# Patient Record
Sex: Female | Born: 1990
Health system: Southern US, Community
[De-identification: ages and names within clinical notes are randomized; demographics above are authoritative.]

## PROBLEM LIST (undated history)

## (undated) ENCOUNTER — Inpatient Hospital Stay (HOSPITAL_COMMUNITY): Payer: Self-pay

## (undated) DIAGNOSIS — K3184 Gastroparesis: Secondary | ICD-10-CM

## (undated) DIAGNOSIS — R1031 Right lower quadrant pain: Secondary | ICD-10-CM

## (undated) DIAGNOSIS — K626 Ulcer of anus and rectum: Secondary | ICD-10-CM

## (undated) DIAGNOSIS — F419 Anxiety disorder, unspecified: Secondary | ICD-10-CM

## (undated) HISTORY — DX: Right lower quadrant pain: R10.31

## (undated) HISTORY — DX: Ulcer of anus and rectum: K62.6

## (undated) HISTORY — DX: Anxiety disorder, unspecified: F41.9

## (undated) HISTORY — DX: Gastroparesis: K31.84

---
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
## 2011-07-26 ENCOUNTER — Ambulatory Visit (INDEPENDENT_AMBULATORY_CARE_PROVIDER_SITE_OTHER): Payer: BC Managed Care – PPO | Admitting: Gastroenterology

## 2011-07-26 ENCOUNTER — Encounter: Payer: Self-pay | Admitting: Gastroenterology

## 2011-07-26 VITALS — BP 95/63 | HR 70 | Temp 97.6°F | Ht 65.0 in | Wt 143.2 lb

## 2011-07-26 DIAGNOSIS — K6289 Other specified diseases of anus and rectum: Secondary | ICD-10-CM | POA: Insufficient documentation

## 2011-07-26 DIAGNOSIS — R109 Unspecified abdominal pain: Secondary | ICD-10-CM

## 2011-07-26 MED ORDER — SOD PICOSULFATE-MAG OX-CIT ACD 10-3.5-12 MG-GM-GM PO PACK: 1.0000 | PACK | Freq: Once | ORAL | Status: DC

## 2011-07-26 NOTE — Progress Notes (Signed)
Faxed to PCP

## 2011-07-26 NOTE — Progress Notes (Signed)
  Subjective:    Patient ID: Andrea Huynh, female    DOB: 12-31-1990, 21 y.o.   MRN: [id number]  PCP: Prudy Feeler, MD  HPI Pain in rectum ~2 years ago-CT rectal inflammation. Has pain in right side. Had severe diarrhea. No rectal bleeding ever. Had been pain free for 2 years. Started ~1 mo ago AGAIN AND HAD DIARRHEA. Seen in ED and repeat CT shows inflammation. No FAM hX COSUIN HAD CROHN'S DISEASE/NO COLON CA OR POLYPS.  GRANDFATHER ?IBS. WEIGHT LOSS: 2 MOS AGO 150-160 LBS. UNINTENTIONAL LOSS. APPETITE OK . NO RASH OR SORES IN HER MOUTH. No joint pain or swelling. STOOLS VARY. WHEN SHE DOES HURT IT'S FOR A COUPLE DAYS. No rectal urgency. Has the urge to go but she doesn't go.  PAIN: RLQ, NO RADIATION, SHARP, KNIFELIKE-LASTS 2-3 DAYS. FEELS NAUSEA BUT NO VOMITIING. NO TRAVEL, OR FRESH WATER. WAS ON ABX IN DEC, BUT NO ABX PRIOR TO ED VISIT. ABX CAN CAUSE WATERY STOOL.  No past medical history on file.  No past surgical history on file.  No Known Allergies  No current outpatient prescriptions on file.   Family History  Problem Relation Age of Onset  . Colon cancer Neg Hx   . Colon polyps Neg Hx   . Stomach cancer Neg Hx   COUSIN HAD CROHN'S DISEASE  History   Social History  . 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 Smoking status: Never Smoker   . Smokeless tobacco: Not on file  . Alcohol Use: No  . Drug Use: No    Review of Systems     Objective:   Physical Exam  Vitals reviewed. Constitutional: She is oriented to person, place, and time. She appears well-developed and well-nourished. No distress.  HENT:  Head: Normocephalic and atraumatic.  Mouth/Throat: Oropharynx is clear and moist. No oropharyngeal exudate.  Eyes: Pupils are equal, round, and reactive to light. No scleral icterus.  Neck: Normal range of motion. Neck supple.  Cardiovascular: Normal rate, regular rhythm and normal heart sounds.     Pulmonary/Chest: Effort normal and breath sounds normal. No respiratory distress.  Abdominal: Soft. Bowel sounds are normal. She exhibits no distension. There is tenderness.  Musculoskeletal: Normal range of motion. She exhibits no edema.  Lymphadenopathy:    She has no cervical adenopathy.  Neurological: She is alert and oriented to person, place, and time.       NO FOCAL DEFICITS   Skin: No rash (pretibial) noted.  Psychiatric: She has a normal mood and affect.          Assessment & Plan:

## 2011-07-26 NOTE — Patient Instructions (Signed)
SUBMIT LOOSE OR WATERY STOOL SAMPLE ASAP. WE WILL SCHEDULE A COLONOSCOPY ON JAN 25. FOLLOW UP IN 3 MOS.

## 2011-07-27 NOTE — Assessment & Plan Note (Addendum)
Persistent finding on CT since 2010. PT LIKELY HAS CROHN'S ILEO-COLITIS. 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 NO NSAID USE.   CHECK CDIFF PCR. TCS JAN 25. OPV IN 3 MOS.

## 2011-08-01 ENCOUNTER — Encounter: Payer: Self-pay | Admitting: Gastroenterology

## 2011-08-01 ENCOUNTER — Encounter (HOSPITAL_COMMUNITY): Payer: Self-pay | Admitting: Pharmacy Technician

## 2011-08-02 NOTE — Progress Notes (Signed)
LMOM for pt to call and let us know if she has done the stool test.

## 2011-08-02 NOTE — Progress Notes (Unsigned)
Called and [facility] for pt to let us know if she has done the stool test . (C-Diff)  Pt returned call and said that the specimen that she took to the lab was not watery enough, her stools are more formed now.  She is scheduled for procedure on Fri.  Transferred call to  Crystal's VM, pt having difficulty with prep being too expensive.

## 2011-08-02 NOTE — Progress Notes (Signed)
Prep has been changed - and [facility] in [place]- I called and confirmed they received it

## 2011-08-02 NOTE — Progress Notes (Signed)
Remind pt to submit sample

## 2011-08-03 MED ORDER — SODIUM CHLORIDE 0.45 % IV SOLN
Freq: Once | INTRAVENOUS | Status: AC
  Administered 2011-08-04: 1000 mL via INTRAVENOUS

## 2011-08-04 ENCOUNTER — Other Ambulatory Visit: Payer: Self-pay | Admitting: Gastroenterology

## 2011-08-04 ENCOUNTER — Encounter (HOSPITAL_COMMUNITY)
Admission: RE | Disposition: A | Discharge status: Home / Self Care | Payer: Self-pay | Source: Ambulatory Visit | Attending: Gastroenterology

## 2011-08-04 ENCOUNTER — Encounter (HOSPITAL_COMMUNITY): Payer: Self-pay | Admitting: *Deleted

## 2011-08-04 ENCOUNTER — Ambulatory Visit (HOSPITAL_COMMUNITY)
Admission: RE | Admit: 2011-08-04 | Discharge: 2011-08-04 | Disposition: A | Discharge status: Home / Self Care | Payer: BC Managed Care – PPO | Source: Ambulatory Visit | Attending: Gastroenterology | Admitting: Gastroenterology

## 2011-08-04 DIAGNOSIS — K6289 Other specified diseases of anus and rectum: Secondary | ICD-10-CM

## 2011-08-04 DIAGNOSIS — K5289 Other specified noninfective gastroenteritis and colitis: Secondary | ICD-10-CM | POA: Insufficient documentation

## 2011-08-04 DIAGNOSIS — K626 Ulcer of anus and rectum: Secondary | ICD-10-CM | POA: Insufficient documentation

## 2011-08-04 DIAGNOSIS — R109 Unspecified abdominal pain: Secondary | ICD-10-CM

## 2011-08-04 DIAGNOSIS — R197 Diarrhea, unspecified: Secondary | ICD-10-CM

## 2011-08-04 DIAGNOSIS — R933 Abnormal findings on diagnostic imaging of other parts of digestive tract: Secondary | ICD-10-CM

## 2011-08-04 HISTORY — PX: COLONOSCOPY: SHX5424

## 2011-08-04 SURGERY — COLONOSCOPY
Anesthesia: Moderate Sedation

## 2011-08-04 MED ORDER — HYDROCODONE-ACETAMINOPHEN 5-325 MG PO TABS
ORAL_TABLET | ORAL | Status: AC
Start: 2011-08-04 — End: 2011-08-14

## 2011-08-04 MED ORDER — MEPERIDINE HCL 100 MG/ML IJ SOLN
INTRAMUSCULAR | Status: DC | PRN
  Administered 2011-08-04: 25 mg via INTRAVENOUS
  Administered 2011-08-04: 50 mg via INTRAVENOUS

## 2011-08-04 MED ORDER — MEPERIDINE HCL 100 MG/ML IJ SOLN
INTRAMUSCULAR | Status: AC
  Filled 2011-08-04: qty 2

## 2011-08-04 MED ORDER — MIDAZOLAM HCL 5 MG/5ML IJ SOLN
INTRAMUSCULAR | Status: DC | PRN
  Administered 2011-08-04 (×2): 2 mg via INTRAVENOUS

## 2011-08-04 MED ORDER — MIDAZOLAM HCL 5 MG/5ML IJ SOLN
INTRAMUSCULAR | Status: AC
  Filled 2011-08-04: qty 10

## 2011-08-04 NOTE — Progress Notes (Signed)
Reminder in epic to follow up in 3 months °

## 2011-08-04 NOTE — H&P (View-Only) (Signed)
  Subjective:    Patient ID: Andrea Huynh, female    DOB: 07/23/1990, 21 y.o.   MRN: [id number]  PCP: ANGEL JONES, MD  HPI Pain in rectum ~2 years ago-CT rectal inflammation. Has pain in right side. Had severe diarrhea. No rectal bleeding ever. Had been pain free for 2 years. Started ~1 mo ago AGAIN AND HAD DIARRHEA. Seen in ED and repeat CT shows inflammation. No FAM hX COSUIN HAD CROHN'S DISEASE/NO COLON CA OR POLYPS.  GRANDFATHER ?IBS. WEIGHT LOSS: 2 MOS AGO 150-160 LBS. UNINTENTIONAL LOSS. APPETITE OK . NO RASH OR SORES IN HER MOUTH. No joint pain or swelling. STOOLS VARY. WHEN SHE DOES HURT IT'S FOR A COUPLE DAYS. No rectal urgency. Has the urge to go but she doesn't go.  PAIN: RLQ, NO RADIATION, SHARP, KNIFELIKE-LASTS 2-3 DAYS. FEELS NAUSEA BUT NO VOMITIING. NO TRAVEL, OR FRESH WATER. WAS ON ABX IN DEC, BUT NO ABX PRIOR TO ED VISIT. ABX CAN CAUSE WATERY STOOL.  No past medical history on file.  No past surgical history on file.  No Known Allergies  No current outpatient prescriptions on file.   Family History  Problem Relation Age of Onset  . Colon cancer Neg Hx   . Colon polyps Neg Hx   . Stomach cancer Neg Hx   COUSIN HAD CROHN'S DISEASE  History   Social History  . 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 Smoking status: Never Smoker   . Smokeless tobacco: Not on file  . Alcohol Use: No  . Drug Use: No    Review of Systems     Objective:   Physical Exam  Vitals reviewed. Constitutional: She is oriented to person, place, and time. She appears well-developed and well-nourished. No distress.  HENT:  Head: Normocephalic and atraumatic.  Mouth/Throat: Oropharynx is clear and moist. No oropharyngeal exudate.  Eyes: Pupils are equal, round, and reactive to light. No scleral icterus.  Neck: Normal range of motion. Neck supple.  Cardiovascular: Normal rate, regular rhythm and normal heart sounds.     Pulmonary/Chest: Effort normal and breath sounds normal. No respiratory distress.  Abdominal: Soft. Bowel sounds are normal. She exhibits no distension. There is tenderness.  Musculoskeletal: Normal range of motion. She exhibits no edema.  Lymphadenopathy:    She has no cervical adenopathy.  Neurological: She is alert and oriented to person, place, and time.       NO FOCAL DEFICITS   Skin: No rash (pretibial) noted.  Psychiatric: She has a normal mood and affect.          Assessment & Plan:   

## 2011-08-04 NOTE — Interval H&P Note (Signed)
History and Physical Interval Note:  08/04/2011 11:18 AM  Andrea Huynh  has presented today for surgery, with the diagnosis of abd pain  The various methods of treatment have been discussed with the patient and family. After consideration of risks, benefits and other options for treatment, the patient has consented to  Procedure(s): COLONOSCOPY as a surgical intervention .  The patients' history has been reviewed, patient examined, no change in status, stable for surgery.  I have reviewed the patients' chart and labs.  Questions were answered to the patient's satisfaction.     [employer]

## 2011-08-06 NOTE — Op Note (Signed)
[facility] [address] [place], [place]  [address]  COLONOSCOPY PROCEDURE REPORT  PATIENT:  Andrea Huynh, Andrea Huynh  MR#:  [id number] BIRTHDATE:  03/23/1991, 20 yrs. old  GENDER:  female  ENDOSCOPIST:  Jonette Eva, MD REF. BY: ASSISTANT:  PROCEDURE DATE:  08/04/2011 PROCEDURE:  ILEOColonoscopy with biopsy  INDICATIONS:  DIARRHEA, & ABDOMNAL PAIN RECTAL WALL ABNL ON CT IN 2010 & 2012  MEDICATIONS:   Demerol 75 mg IV, Versed 4 mg IV  DESCRIPTION OF PROCEDURE:    Physical exam was performed. Informed consent was obtained from the patient after explaining the benefits, risks, and alternatives to procedure.  The patient was connected to monitor and placed in left lateral position. Continuous oxygen was provided by nasal cannula and IV medicine administered through an indwelling cannula.  After administration of sedation and rectal exam, the patient's rectum was intubated and the EC-[id number] ([id number]) colonoscope was advanced under direct visualization to the ILEUM.  The scope was removed slowly by carefully examining the color, texture, anatomy, and integrity mucosa on the way out.  The patient was recovered in endoscopy and discharged home in satisfactory condition. <<PROCEDUREIMAGES>>  FINDINGS:  Ulcers in the rectum BIOPSIED VIA COLD FORCEPS. NL COLON & ILEUM. BIOPSIES OBTAIED VIA COLD FORCEPS.  PREP QUALITY: EXCELLENT CECAL W/D TIME:    20 minutes  COMPLICATIONS:    None  ENDOSCOPIC IMPRESSION: 1) Ulcers in the rectum 2) NORMAL ILEUM & COLON  RECOMMENDATIONS: AWAIT BIOPSIES AVOID NSAIDS  REPEAT EXAM:  No  ______________________________ Jonette Eva, MD  CC:  n. eSIGNED:   Mireya Meditz at 08/06/2011 03:52 PM  Cristopher Estimable, [phone]

## 2011-08-09 ENCOUNTER — Telehealth: Payer: Self-pay | Admitting: Gastroenterology

## 2011-08-09 MED ORDER — LINACLOTIDE 145 MCG PO CAPS: 1.0000 | ORAL_CAPSULE | Freq: Every day | ORAL | Status: DC

## 2011-08-09 NOTE — Telephone Encounter (Signed)
Referral faxed to [facility] 

## 2011-08-09 NOTE — Telephone Encounter (Signed)
I personally reviewed the specimens with pathology. CALLED PT TO DISCUSS RESULTS. Pt does not have evidence of IBD. ShE has 1-2 BMs/week and strains frequently to have Bms. PT NEEDS LINZESS TO TREAT CIC & ANORECTAL MANOMETRY WITH BALLOON EXPULSION TEST asap AT [facility], dX: SOLITARY RECTAL ULCER SYNDROME-evaluate for sphincter dysfunction.

## 2011-08-10 ENCOUNTER — Encounter (HOSPITAL_COMMUNITY): Payer: Self-pay | Admitting: Gastroenterology

## 2011-08-10 NOTE — Telephone Encounter (Signed)
Results Cc to PCP  

## 2011-08-14 ENCOUNTER — Telehealth: Payer: Self-pay | Admitting: Gastroenterology

## 2011-08-14 NOTE — Telephone Encounter (Signed)
Patient said insurance does not pay for Linzess and shes asking for something cheaper to replace it, please advise??

## 2011-08-15 NOTE — Telephone Encounter (Signed)
Called Rx to CC at [facility] in [place]. LMOM for pt that it has been called in .

## 2011-08-15 NOTE — Telephone Encounter (Signed)
CALL IN AMITIZA 24 MCG PO QHS FOR 7 DAYS THEN 1 PO BID, #[phone].

## 2011-08-17 ENCOUNTER — Telehealth: Payer: Self-pay

## 2011-08-17 NOTE — Telephone Encounter (Signed)
LATE ENTRY: Pt called at end of day yesterday and said she cannot afford the Amitiza either. Please advise!

## 2011-08-20 NOTE — Telephone Encounter (Signed)
Please call pt. She should use MOM caps 2 po bid.

## 2011-08-21 NOTE — Telephone Encounter (Signed)
Pt informed

## 2011-09-13 ENCOUNTER — Telehealth: Payer: Self-pay | Admitting: Gastroenterology

## 2011-09-13 NOTE — Telephone Encounter (Signed)
Pt had anorectal manometry on 02/15

## 2011-09-21 ENCOUNTER — Telehealth: Payer: Self-pay

## 2011-09-21 NOTE — Telephone Encounter (Signed)
[facility]- got report- gave to DS

## 2011-09-21 NOTE — Telephone Encounter (Signed)
Per Lorenza Burton, NP, no pelvic floor dysfuncion is present. Dr. Evelina Dun will review and make recommendations when she returns on Mon.

## 2011-09-21 NOTE — Telephone Encounter (Signed)
Pt would like results of anorectal manometry done on 08/25/2011 at [facility]. I told her I will have Crystal check on this.

## 2011-09-25 NOTE — Telephone Encounter (Signed)
REPORTEDLY ARM-NO PELVIC FLOOR DYSFUNCTION. OPV IN APR 2013 e 30 TO DISCUSS NEXT STEP, Dx: solitary rectal syndrome.. Please make pt aware she will need to have a rectal exam with her next visit.

## 2011-09-26 NOTE — Telephone Encounter (Signed)
Pt informed

## 2011-09-29 ENCOUNTER — Encounter: Payer: Self-pay | Admitting: Gastroenterology

## 2011-09-29 NOTE — Telephone Encounter (Signed)
appt made for 04/18 - letter mailed

## 2011-10-26 ENCOUNTER — Ambulatory Visit (INDEPENDENT_AMBULATORY_CARE_PROVIDER_SITE_OTHER): Payer: BC Managed Care – PPO | Admitting: Gastroenterology

## 2011-10-26 ENCOUNTER — Encounter: Payer: Self-pay | Admitting: Gastroenterology

## 2011-10-26 VITALS — BP 102/55 | HR 63 | Temp 98.1°F | Ht 65.0 in | Wt 131.0 lb

## 2011-10-26 DIAGNOSIS — K623 Rectal prolapse: Secondary | ICD-10-CM

## 2011-10-26 NOTE — Progress Notes (Signed)
  Subjective:    Patient ID: Andrea Huynh, female    DOB: [birth year], 21 y.o.   MRN: [id number]  PCP: Yetta Barre  HPI Pain in RLQ is better. BMs: q2-3 days, not loose unless she eats the wrong thing. Feels like when she does have a BM, she doesn't complete empty her rectum. No fiber supplements. After she uses the BR, she feels like something is there sometimes. LOST 12 LBS SINCE LAST VISIT. 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  Past Medical History  Diagnosis Date  . Abdominal pain, RLQ 2010    Past Surgical History  Procedure Date  . Colonoscopy 08/04/2011    Procedure: COLONOSCOPY;  Surgeon: Arlyce Harman, MD;  Location: [facility];  Service: Endoscopy;  Laterality: N/A;  12:30   No Known Allergies  No current outpatient prescriptions on file.       Review of Systems     Objective:   Physical Exam  Vitals reviewed. Constitutional: She is oriented to person, place, and time. She appears well-developed and well-nourished. No distress.  HENT:  Head: Normocephalic and atraumatic.  Mouth/Throat: Oropharynx is clear and moist. No oropharyngeal exudate.  Eyes: Pupils are equal, round, and reactive to light.  Neck: Normal range of motion. Neck supple.  Cardiovascular: Normal rate and normal heart sounds.   Pulmonary/Chest: Effort normal and breath sounds normal. No respiratory distress.  Abdominal: Soft. Bowel sounds are normal. She exhibits no distension. There is no tenderness.  Musculoskeletal: She exhibits no edema.  Neurological: She is alert and oriented to person, place, and time.          Assessment & Plan:

## 2011-10-26 NOTE — Assessment & Plan Note (Signed)
Pt has symptoms of rectal prolapse and identified prolapse from images on the internet. She has had a TCS with rectal ulcer W/O EVIDENCE FOR IBD. SHE DOES NOT HAVE HEMORRHOIDS.  ADD FIBER 1-2X/DAY. KEGEK EXERCISES 10X(HOLD FOR 5 SEC) 2-3 TIMES A DAY. REFER TO DR. Lovell Sheehan FOR SURGICAL EVALUATION. FOLLOW UP IN 6 MOS.

## 2011-10-26 NOTE — Patient Instructions (Signed)
I BELIEVE YOUR SYMPTOMS AND ULCERS ARE COMING FROM RECTAL PROLAPSE.  ADD FIBER ONCE OR TWICE DAILY(METAMUCIL OR BENEFIBER) DRINK WATER TO KEEP URINE LIGHT YELLOW. SEE DR. Lovell Sheehan TO REPAIR YOUR RECTUM. FOLLOW UP IN 6 MOS.  What is rectal prolapse? Rectal prolapse occurs when part or all of the wall of the rectum slides out of place, sometimes sticking out of the anus. See a picture of rectal prolapse .  Risk factors for adults include:  Straining during bowel movements because of constipation.  Tissue damage caused by surgery or childbirth.  Weakness of pelvic floor muscles that occurs naturally with age.  What are the symptoms? The first symptoms of rectal prolapse may be: Leakage of stool from the anus (fecal incontinence).  Leakage of mucus or blood from the anus (wet anus).  Other symptoms of rectal prolapse include: A feeling of having full bowels and an urgent need to have a bowel movement.  Passage of many very small stools.  The feeling of not being able to empty the bowels completely.  Anal pain, itching, irritation, and bleeding.  Bright red tissue that sticks out of the anus.   TREATMENT for adults may help treat the prolapse and may be tried before other types of treatments.  You can push the prolapse into place.  Avoid constipation. Drink plenty of water, and eat fruits, vegetables, and other foods that contain fiber. Changes in diet often are enough to improve or reverse a prolapse of the lining of the rectum (partial prolapse).  Do Kegel exercises to help strengthen the muscles of the pelvic area.  Don't strain while having a bowel movement. Use a stool softener if you need to.   People who have a complete prolapse or who have a partial prolapse that doesn't improve with a change in diet will need surgery. Surgery involves attaching the rectum to the muscles of the pelvic floor or the lower end of the spine (sacrum). Or surgery might involve removing a section of the  large intestine that is no longer supported by the surrounding tissue. Both procedures may be done in the same surgery.

## 2011-10-26 NOTE — Progress Notes (Signed)
Reminder in epic to follow up in 6 months °

## 2011-10-26 NOTE — Progress Notes (Signed)
Cc to PCP 

## 2011-10-30 ENCOUNTER — Telehealth: Payer: Self-pay | Admitting: Gastroenterology

## 2011-10-30 NOTE — Telephone Encounter (Signed)
Pt is scheduled to see Dr Lovell Sheehan on 04/25 @ 10:15- LMOVM with appt details

## 2012-04-04 ENCOUNTER — Encounter: Payer: Self-pay | Admitting: Gastroenterology

## 2012-04-19 NOTE — Progress Notes (Signed)
Patient ID: Andrea Huynh, female   DOB: 1990/10/01, 21 y.o.   MRN: [id number] Pt called today and said that she was not having any problems so she did not think she needed to come in for an appointment.

## 2013-03-25 ENCOUNTER — Telehealth: Payer: Self-pay | Admitting: General Practice

## 2013-03-25 NOTE — Telephone Encounter (Signed)
Patient called and wanted to know when should she have a repeat tcs.  She had her last one in January 2013.  Please advise?

## 2013-03-30 NOTE — Telephone Encounter (Signed)
PLEASE CALL PT. SHE NEEDS ANOTHER TCS AT AGE 22 UNLESS A SIBLING OR PARENT DEVELOPS COLON CANCER OR POLYPS. IF SHE IS HAVING RECTAL PAIN, BLEEDING, OR DIARRHEA. SHE WOULD NEED A REPEAT FLEX SIG WHICH LOOKS AT THE LAST THIRD OF HER COLON.

## 2013-03-31 NOTE — Telephone Encounter (Signed)
Called and informed pt.  

## 2013-12-17 ENCOUNTER — Encounter: Payer: Self-pay | Admitting: Gastroenterology

## 2013-12-17 ENCOUNTER — Other Ambulatory Visit: Payer: Self-pay | Admitting: Gastroenterology

## 2013-12-17 ENCOUNTER — Encounter (INDEPENDENT_AMBULATORY_CARE_PROVIDER_SITE_OTHER): Payer: Self-pay

## 2013-12-17 ENCOUNTER — Ambulatory Visit (INDEPENDENT_AMBULATORY_CARE_PROVIDER_SITE_OTHER): Payer: BC Managed Care – PPO | Admitting: Gastroenterology

## 2013-12-17 VITALS — BP 114/66 | HR 70 | Temp 97.9°F | Resp 18 | Ht 65.0 in | Wt 142.8 lb

## 2013-12-17 DIAGNOSIS — K6289 Other specified diseases of anus and rectum: Secondary | ICD-10-CM

## 2013-12-17 DIAGNOSIS — K623 Rectal prolapse: Secondary | ICD-10-CM

## 2013-12-17 DIAGNOSIS — K5900 Constipation, unspecified: Secondary | ICD-10-CM

## 2013-12-17 NOTE — Assessment & Plan Note (Signed)
LIKELY RELATED TO RECTAL PROLAPSE. SX MAY BE EXACERBATED BY FUNCTIONAL CONSTIPATION. PT HAD ANAL RECTAL MANOMETRY IN 2013 AND SHE DOES NOT HAVE PELVIC FLOOR DYSFUNCTION. DISCUSSED WITH MARK BOLES. SITZ MARKER CAPSULE NO LONGER AVAILABLE.   SMART PILL TO EVALUATE FOR DELAYED COLONIC TRANSIT. FAX ANAL RECTAL MANOMETRY RECORDS TO DR. Lovell Sheehan.

## 2013-12-17 NOTE — Patient Instructions (Signed)
COMPLETE SMART PILL.  SCHEDULE FLEX SIG.  SEE SURGERY IN 3 WEEKS.  FOLLOW UP IN 3 MOS.

## 2013-12-17 NOTE — Progress Notes (Signed)
   Subjective:    Patient ID: Andrea Huynh, female    DOB: 03/04/1991, 23 y.o.   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 STILL HAVING PROBLEMS PASSING STOOLS. NO RECTAL BLEEDING EVER. BM: #5-6. RARE NL FORMED STOOL. WAITED ONE WEEK TO HAVE A BM BUT IT WAS NORMAL. NEVER SAW A SURGEON. STOMACH FELT TIGHT WHEN SHE COULDN'T MOVE R BOWELS. FELT LIKE SHE HAD A LOT OF GAS. FIBER MAKES HER SYMPTOMS. AFTER HAS BM HAS MILD RECTAL PAIN. NO RECTAL ITCHING OR PRESSURE. PT DENIES FEVER, CHILLS, BRBPR, nausea, vomiting, melena, diarrhea, problems swallowing, OR heartburn or indigestion.   Past Medical History  Diagnosis Date  . Abdominal pain, RLQ 2010   Past Surgical History  Procedure Laterality Date  . Colonoscopy  08/04/2011    Procedure: COLONOSCOPY;  Surgeon: Jette Lewan M Alicia Ackert, MD;  Location: [facility];  Service: Endoscopy;  Laterality: N/A;  12:30   No Known Allergies  No current outpatient prescriptions on file.   No current facility-administered medications for this visit.   Family History  Problem Relation Age of Onset  . Colon cancer Neg Hx   . Colon polyps Neg Hx   . Stomach cancer Neg Hx   . Constipation      History  Substance Use Topics  . Smoking status: Never Smoker   . Smokeless tobacco: Not on file  . Alcohol Use: No      Review of Systems     Objective:   Physical Exam  Vitals reviewed. Constitutional: She is oriented to person, place, and time. She appears well-nourished. No distress.  HENT:  Head: Normocephalic and atraumatic.  Mouth/Throat: Oropharynx is clear and moist. No oropharyngeal exudate.  Eyes: Pupils are equal, round, and reactive to light. No scleral icterus.  Neck: Normal range of motion. Neck supple.  Cardiovascular: Normal rate, regular rhythm and normal heart sounds.   Pulmonary/Chest: Effort normal and breath sounds normal. No respiratory distress.  Abdominal: Soft. Bowel sounds  are normal. She exhibits no distension. There is no tenderness.  Musculoskeletal: She exhibits no edema.  Lymphadenopathy:    She has no cervical adenopathy.  Neurological: She is alert and oriented to person, place, and time.  NO FOCAL DEFICITS   Psychiatric: She has a normal mood and affect.          Assessment & Plan:   

## 2013-12-17 NOTE — Assessment & Plan Note (Signed)
SX NOT IMRPOVED. WORSE WITH FIBER.  SMART PILL FLEX SIG SURGERY REFERRAL OPV IN 4 MOS

## 2013-12-18 ENCOUNTER — Telehealth: Payer: Self-pay | Admitting: Gastroenterology

## 2013-12-18 NOTE — Telephone Encounter (Signed)
Patient has decided to see Dr. Byrd Hesselbach at [facility] as an General Surgeon and I have faxed records to them and cancelled appointment with Dr. Lovell Sheehan

## 2013-12-18 NOTE — Telephone Encounter (Signed)
No PAC is Required for Smartpill Study per Buckhorn C @ [employer]

## 2013-12-19 NOTE — Progress Notes (Signed)
Reminder in epic °

## 2013-12-22 ENCOUNTER — Encounter (HOSPITAL_COMMUNITY): Payer: Self-pay

## 2013-12-22 ENCOUNTER — Encounter (HOSPITAL_COMMUNITY): Payer: Self-pay | Admitting: *Deleted

## 2013-12-22 ENCOUNTER — Ambulatory Visit (HOSPITAL_COMMUNITY)
Admission: RE | Admit: 2013-12-22 | Discharge: 2013-12-22 | Disposition: A | Discharge status: Home / Self Care | Payer: BC Managed Care – PPO | Source: Ambulatory Visit | Attending: Gastroenterology | Admitting: Gastroenterology

## 2013-12-22 ENCOUNTER — Encounter (HOSPITAL_COMMUNITY)
Admission: RE | Disposition: A | Discharge status: Home / Self Care | Payer: Self-pay | Source: Ambulatory Visit | Attending: Gastroenterology

## 2013-12-22 DIAGNOSIS — K6289 Other specified diseases of anus and rectum: Secondary | ICD-10-CM | POA: Insufficient documentation

## 2013-12-22 DIAGNOSIS — K5900 Constipation, unspecified: Secondary | ICD-10-CM

## 2013-12-22 SURGERY — CAPSULE ENDOSCOPY, USING SMARTPILL MOTILITY TESTING SYSTEM

## 2013-12-22 NOTE — OR Nursing (Addendum)
Patient in for Smart Pill. Asked that her report be faxed to her surgeon[facility]Dr[facility] along with her flexible sigmoidoscopy procedure note that she is due to have next Monday. Fax number is [phone].

## 2013-12-29 ENCOUNTER — Encounter (HOSPITAL_COMMUNITY)
Admission: RE | Disposition: A | Discharge status: Home / Self Care | Payer: Self-pay | Source: Ambulatory Visit | Attending: Gastroenterology

## 2013-12-29 ENCOUNTER — Encounter (HOSPITAL_COMMUNITY): Payer: Self-pay | Admitting: *Deleted

## 2013-12-29 ENCOUNTER — Ambulatory Visit (HOSPITAL_COMMUNITY)
Admission: RE | Admit: 2013-12-29 | Discharge: 2013-12-29 | Disposition: A | Discharge status: Home / Self Care | Payer: BC Managed Care – PPO | Source: Ambulatory Visit | Attending: Gastroenterology | Admitting: Gastroenterology

## 2013-12-29 DIAGNOSIS — K5900 Constipation, unspecified: Secondary | ICD-10-CM | POA: Diagnosis present

## 2013-12-29 DIAGNOSIS — K6289 Other specified diseases of anus and rectum: Secondary | ICD-10-CM

## 2013-12-29 DIAGNOSIS — K5120 Ulcerative (chronic) proctitis without complications: Secondary | ICD-10-CM | POA: Diagnosis not present

## 2013-12-29 HISTORY — PX: FLEXIBLE SIGMOIDOSCOPY: SHX5431

## 2013-12-29 SURGERY — SIGMOIDOSCOPY, FLEXIBLE
Anesthesia: Moderate Sedation

## 2013-12-29 MED ORDER — MEPERIDINE HCL 100 MG/ML IJ SOLN
INTRAMUSCULAR | Status: DC | PRN
  Administered 2013-12-29 (×2): 25 mg via INTRAVENOUS

## 2013-12-29 MED ORDER — MEPERIDINE HCL 100 MG/ML IJ SOLN
INTRAMUSCULAR | Status: AC
  Filled 2013-12-29: qty 2

## 2013-12-29 MED ORDER — MIDAZOLAM HCL 5 MG/5ML IJ SOLN
INTRAMUSCULAR | Status: DC | PRN
  Administered 2013-12-29 (×2): 2 mg via INTRAVENOUS

## 2013-12-29 MED ORDER — SODIUM CHLORIDE 0.9 % IV SOLN
INTRAVENOUS | Status: DC
  Administered 2013-12-29: 11:00:00 via INTRAVENOUS

## 2013-12-29 MED ORDER — MIDAZOLAM HCL 5 MG/5ML IJ SOLN
INTRAMUSCULAR | Status: AC
  Filled 2013-12-29: qty 10

## 2013-12-29 MED ORDER — STERILE WATER FOR IRRIGATION IR SOLN
Status: DC | PRN
  Administered 2013-12-29: 12:00:00

## 2013-12-29 NOTE — H&P (View-Only) (Signed)
   Subjective:    Patient ID: Andrea Huynh, female    DOB: 1990-10-30, 23 y.o.   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 STILL HAVING PROBLEMS PASSING STOOLS. NO RECTAL BLEEDING EVER. BM: #5-6. RARE NL FORMED STOOL. WAITED ONE WEEK TO HAVE A BM BUT IT WAS NORMAL. NEVER SAW A SURGEON. STOMACH FELT TIGHT WHEN SHE COULDN'T MOVE R BOWELS. FELT LIKE SHE HAD A LOT OF GAS. FIBER MAKES HER SYMPTOMS. AFTER HAS BM HAS MILD RECTAL PAIN. NO RECTAL ITCHING OR PRESSURE. PT DENIES FEVER, CHILLS, BRBPR, nausea, vomiting, melena, diarrhea, problems swallowing, OR heartburn or indigestion.   Past Medical History  Diagnosis Date  . Abdominal pain, RLQ 2010   Past Surgical History  Procedure Laterality Date  . Colonoscopy  08/04/2011    Procedure: COLONOSCOPY;  Surgeon: Arlyce HarmanSandi M Pradeep Beaubrun, MD;  Location: [facility];  Service: Endoscopy;  Laterality: N/A;  12:30   No Known Allergies  No current outpatient prescriptions on file.   No current facility-administered medications for this visit.   Family History  Problem Relation Age of Onset  . Colon cancer Neg Hx   . Colon polyps Neg Hx   . Stomach cancer Neg Hx   . Constipation      History  Substance Use Topics  . Smoking status: Never Smoker   . Smokeless tobacco: Not on file  . Alcohol Use: No      Review of Systems     Objective:   Physical Exam  Vitals reviewed. Constitutional: She is oriented to person, place, and time. She appears well-nourished. No distress.  HENT:  Head: Normocephalic and atraumatic.  Mouth/Throat: Oropharynx is clear and moist. No oropharyngeal exudate.  Eyes: Pupils are equal, round, and reactive to light. No scleral icterus.  Neck: Normal range of motion. Neck supple.  Cardiovascular: Normal rate, regular rhythm and normal heart sounds.   Pulmonary/Chest: Effort normal and breath sounds normal. No respiratory distress.  Abdominal: Soft. Bowel sounds  are normal. She exhibits no distension. There is no tenderness.  Musculoskeletal: She exhibits no edema.  Lymphadenopathy:    She has no cervical adenopathy.  Neurological: She is alert and oriented to person, place, and time.  NO FOCAL DEFICITS   Psychiatric: She has a normal mood and affect.          Assessment & Plan:

## 2013-12-29 NOTE — Op Note (Signed)
[facility] [address] [place], [id number]   FLEX SIGMOIDOSCOPY PROCEDURE REPORT  PATIENT: Andrea Huynh, Andrea L.  MR#: [id number] BIRTHDATE: 1991-03-23 , 23  yrs. old GENDER: Female ENDOSCOPIST: Jonette EvaSandi Fields, MD REFERRED UX:[id number]:Gregory Byrd HesselbachWaters, M.D. PROCEDURE DATE:  12/29/2013 PROCEDURE:   Sigmoidoscopy with biopsy INDICATIONS: LIFELONG HISTORY OF DIFFICULTY PASSING STOOL. CONSITPATION, PMHx: SOLITARY RECTAL ULCER SYNDROME( IleoCOLONOSCOPY WITH Bx-JAN 2013-NL ILEUM AND COLON). MEDICATIONS: Demerol 50 mg IV and Versed 4 mg IV  DESCRIPTION OF PROCEDURE:    Physical exam was performed.  Informed consent was obtained from the patient after explaining the benefits, risks, and alternatives to procedure.  The patient was connected to monitor and placed in left lateral position. Continuous oxygen was provided by nasal cannula and IV medicine administered through an indwelling cannula.  After administration of sedation and rectal exam, the patients rectum was intubated and the EC-[id number] ([id number]) and EG-[id number] ([id number])  ADULT colonoscope was advanced under direct visualization to the SPLENIC FLEXURE. The scope was removed slowly by carefully examining the color, texture, anatomy, and integrity mucosa on the way out.  The patient was recovered in endoscopy and discharged home in satisfactory condition.    COLON FINDINGS: Non-bleeding mucosal ulceration was present in the rectum.  Multiple biopsies IN THE RECTUM were performed using JUMBO cold forceps and The mucosa appeared normal, in the sigmoid colon, and descending colon. COLD FORCEPS BIOPSIES OBTAINED. RETROFLEX VIEW COMPLETED WITH DIAGNOSTIC GASTROSCOPE.  PREP QUALITY: adequate   COMPLICATIONS: None  ENDOSCOPIC IMPRESSION: 1.   PERSISTENT ULCERATIVE PROCTITIS  RECOMMENDATIONS: DRINK WATER TO KEEP YOUR URINE LIGHT YELLOW. CONTINUE A HIGH FIBER DIET.  AVOID ITEMS THAT CAUSE BLOATING & GAS.  IBGARD ONE TWICE A  DAY.  BIOPSY WILL BE BACK IN 7 DAYS.  CONSIDER IBD SEROLOGIES.   _____________________________ Andrea DoctoreSignedJonette Eva:  Sandi Fields, MD 12/29/2013 1:52 PMevised

## 2013-12-29 NOTE — Interval H&P Note (Signed)
History and Physical Interval Note:  12/29/2013 12:24 PM  Andrea Huynh  has presented today for surgery, with the diagnosis of PROCHTITIS  The various methods of treatment have been discussed with the patient and family. After consideration of risks, benefits and other options for treatment, the patient has consented to  Procedure(s) with comments: FLEXIBLE SIGMOIDOSCOPY (N/A) - 12:15 as a surgical intervention .  The patient's history has been reviewed, patient examined, no change in status, stable for surgery.  I have reviewed the patient's chart and labs.  Questions were answered to the patient's satisfaction.     Eaton CorporationSandi Fields

## 2013-12-29 NOTE — Discharge Instructions (Signed)
You STILL HAVE AN ULCERATED AREA IN YOUR RECTUM. I BIOPSIED YOUR COLON AND RECTUM.   DRINK WATER TO KEEP YOUR URINE LIGHT YELLOW.  CONTINUE A HIGH FIBER DIET. AVOID ITEMS THAT CAUSE BLOATING & GAS.   STOP BY THE OFFICE AND PICK UP IBGARD SAMPLES AND COUPONS. TAKE ONE TWICE A DAY.  YOUR BIOPSY WILL BE BACK IN 7 DAYS.      ENDOSCOPY Care After Read the instructions outlined below and refer to this sheet in the next week. These discharge instructions provide you with general information on caring for yourself after you leave the hospital. While your treatment has been planned according to the most current medical practices available, unavoidable complications occasionally occur. If you have any problems or questions after discharge, call DR. FIELDS, [phone].  ACTIVITY  You may resume your regular activity, but move at a slower pace for the next 24 hours.   Take frequent rest periods for the next 24 hours.   Walking will help get rid of the air and reduce the bloated feeling in your belly (abdomen).   No driving for 24 hours (because of the medicine (anesthesia) used during the test).   You may shower.   Do not sign any important legal documents or operate any machinery for 24 hours (because of the anesthesia used during the test).    NUTRITION  Drink plenty of fluids.   You may resume your normal diet as instructed by your doctor.   Begin with a light meal and progress to your normal diet. Heavy or fried foods are harder to digest and may make you feel sick to your stomach (nauseated).   Avoid alcoholic beverages for 24 hours or as instructed.    MEDICATIONS  You may resume your normal medications.   WHAT YOU CAN EXPECT TODAY  Some feelings of bloating in the abdomen.   Passage of more gas than usual.   Spotting of blood in your stool or on the toilet paper  .  IF YOU HADBIOPSIES TAKEN  DURING THE SIGMOIDOSCOPY/UPPER ENDOSCOPY:  Eat a soft diet IF YOU  HAVE NAUSEA, BLOATING, ABDOMINAL PAIN, OR VOMITING.    FINDING OUT THE RESULTS OF YOUR TEST Not all test results are available during your visit. DR. Darrick PennaFIELDS WILL CALL YOU WITHIN 7 DAYS OF YOUR PROCEDUE WITH YOUR RESULTS. Do not assume everything is normal if you have not heard from DR. FIELDS IN ONE WEEK, CALL HER OFFICE AT [phone].  SEEK IMMEDIATE MEDICAL ATTENTION AND CALL THE OFFICE: [phone] IF:  You have more than a spotting of blood in your stool.   Your belly is swollen (abdominal distention).   You are nauseated or vomiting.   You have a temperature over 101F.   You have abdominal pain or discomfort that is severe or gets worse throughout the day.    High-Fiber Diet A high-fiber diet changes your normal diet to include more whole grains, legumes, fruits, and vegetables. Changes in the diet involve replacing refined carbohydrates with unrefined foods. The calorie level of the diet is essentially unchanged. The Dietary Reference Intake (recommended amount) for adult males is 38 grams per day. For adult females, it is 25 grams per day. Pregnant and lactating women should consume 28 grams of fiber per day. Fiber is the intact part of a plant that is not broken down during digestion. Functional fiber is fiber that has been isolated from the plant to provide a beneficial effect in the body. PURPOSE  Increase stool  bulk.   Ease and regulate bowel movements.   Lower cholesterol.  INDICATIONS THAT YOU NEED MORE FIBER  Constipation and hemorrhoids.   Uncomplicated diverticulosis (intestine condition) and irritable bowel syndrome.   Weight management.   As a protective measure against hardening of the arteries (atherosclerosis), diabetes, and cancer.   GUIDELINES FOR INCREASING FIBER IN THE DIET  Start adding fiber to the diet slowly. A gradual increase of about 5 more grams (2 slices of whole-wheat bread, 2 servings of most fruits or vegetables, or 1 bowl of high-fiber  cereal) per day is best. Too rapid an increase in fiber may result in constipation, flatulence, and bloating.   Drink enough water and fluids to keep your urine clear or pale yellow. Water, juice, or caffeine-free drinks are recommended. Not drinking enough fluid may cause constipation.   Eat a variety of high-fiber foods rather than one type of fiber.   Try to increase your intake of fiber through using high-fiber foods rather than fiber pills or supplements that contain small amounts of fiber.   The goal is to change the types of food eaten. Do not supplement your present diet with high-fiber foods, but replace foods in your present diet.  INCLUDE A VARIETY OF FIBER SOURCES  Replace refined and processed grains with whole grains, canned fruits with fresh fruits, and incorporate other fiber sources. White rice, white breads, and most bakery goods contain little or no fiber.   Brown whole-grain rice, buckwheat oats, and many fruits and vegetables are all good sources of fiber. These include: broccoli, Brussels sprouts, cabbage, cauliflower, beets, sweet potatoes, white potatoes (skin on), carrots, tomatoes, eggplant, squash, berries, fresh fruits, and dried fruits.   Cereals appear to be the richest source of fiber. Cereal fiber is found in whole grains and bran. Bran is the fiber-rich outer coat of cereal grain, which is largely removed in refining. In whole-grain cereals, the bran remains. In breakfast cereals, the largest amount of fiber is found in those with "bran" in their names. The fiber content is sometimes indicated on the label.   You may need to include additional fruits and vegetables each day.   In baking, for 1 cup white flour, you may use the following substitutions:   1 cup whole-wheat flour minus 2 tablespoons.   1/2 cup white flour plus 1/2 cup whole-wheat flour.

## 2013-12-31 ENCOUNTER — Telehealth: Payer: Self-pay | Admitting: Gastroenterology

## 2013-12-31 NOTE — Telephone Encounter (Addendum)
Called patient TO DISCUSS RESULTS. LVM-SMART PILL SHOWS DELAYED GASTRIC EMPTYING. NL COLON TRANSIT. PATH SHOWS ISCHEMIC INJURY AND NOT IBD. PATH REPORT/SMARTPILL FAXED TO DR.WATERS [phone].

## 2014-01-01 NOTE — Procedures (Signed)
SMART PILL TEST REPORT  TEST STATISTICS(H: MIN) GASTRIC EMPTYING TIME:   8:47    >4h DELAYED GASTRIC EMPTYING    GASTRIC pH:    HIGH 7.4 LOW: 0.5  SMALL BOWEL TRANSIT TIME:  2:16    NORMAL: 2.5-6h  COLON TRANSIT TIME:   15:59   >59h DELAYED COLONIC TRANSIT TIME   SMALL/LARGE BOWEL TRANSIT TIME: 18:16   > 64h DELAYED COMBINED TRANSIT TIME   WHOLE GUT TRANSIT TIME:   27:09   NORMAL < 73h   DIAGNOSIS: GASTROPARESIS, NORMAL COLON TRANSIT TIME  PLAN: 1. AWAIT EVALUATION BY DR. Byrd HesselbachWATERS. 2. CONTINUE TO MONITOR SYMPTOMS.

## 2014-01-02 ENCOUNTER — Encounter (HOSPITAL_COMMUNITY): Payer: Self-pay | Admitting: Gastroenterology

## 2014-01-05 ENCOUNTER — Telehealth: Payer: Self-pay | Admitting: Gastroenterology

## 2014-01-05 NOTE — Telephone Encounter (Signed)
Patient is returning a call to Dr. Darrick PennaFields regarding results

## 2014-01-06 NOTE — Telephone Encounter (Signed)
Routing to Julie.  

## 2014-01-06 NOTE — Telephone Encounter (Signed)
Tried to call pt- LMOM 

## 2014-01-07 NOTE — Telephone Encounter (Signed)
Pt called to return Dr. Evelina DunField's call. I informed her that Dr. Darrick PennaFields is on vacation. I reviewed the procedure note with her and gave her samples of the IBgard to take one bid per Dr. Darrick PennaFields. Pt said that she has seen the surgeon, Dr. Byrd HesselbachWaters. He has her on some enemas and she is to go back in 6 weeks. She would like a phone call from Dr. Darrick PennaFields to discuss when she returns.

## 2014-01-12 NOTE — Telephone Encounter (Signed)
REVIEWED.  

## 2014-01-20 ENCOUNTER — Telehealth: Payer: Self-pay | Admitting: Gastroenterology

## 2014-01-20 NOTE — Telephone Encounter (Signed)
I called pt and made her aware that Dr. Darrick PennaFields is running behind due to her vacation, being at the hospital for Dr. Luvenia Starchourk's vacation and then her mother had a fall.  She said she is not having any problem, but Dr. Darrick PennaFields had called her before vacation and told her to call back in week or so.  Routing to Dr.Fields to contact pt.

## 2014-01-20 NOTE — Telephone Encounter (Signed)
Pt called today checking on her results. She said it's been 2-3 weeks. Please call [phone]

## 2014-01-21 ENCOUNTER — Encounter: Payer: Self-pay | Admitting: Gastroenterology

## 2014-01-21 NOTE — Telephone Encounter (Addendum)
Called patient TO DISCUSS RESULTS. BM EVERY 2-3 DAYS, REALLY SOFT. ONLY HAS NAUSEA/VOMTING ONLY WHEN HAVING PROBLEM AFTER 4 DAYS. SAW DR. Byrd HesselbachWATERS. TAKING SUCRALFATE ENEMAS. EXPLAINED NO NEED TO TREAT GASTROPARESIS IF SHE IS NOT HAVING SYMPTOMS.  OPV WITH DR. Byrd HesselbachWATERS. OPV W/SLF IN OCT 2015 E30 GASTROPARESIS/SOLITARY RECTAL ULCER SYNDROME. THE  patient  WILL CALL WITH QUESTIONS OR CONCERNS.

## 2014-02-09 NOTE — Telephone Encounter (Signed)
Reminder in epic °

## 2014-03-04 ENCOUNTER — Encounter: Payer: Self-pay | Admitting: Gastroenterology

## 2014-03-18 ENCOUNTER — Encounter: Payer: Self-pay | Admitting: Gastroenterology

## 2016-03-31 ENCOUNTER — Encounter: Payer: Self-pay | Admitting: Physician Assistant

## 2016-03-31 ENCOUNTER — Ambulatory Visit (INDEPENDENT_AMBULATORY_CARE_PROVIDER_SITE_OTHER): Payer: 59 | Admitting: Physician Assistant

## 2016-03-31 VITALS — BP 119/77 | HR 75 | Temp 98.3°F | Ht 65.0 in | Wt 168.8 lb

## 2016-03-31 DIAGNOSIS — R635 Abnormal weight gain: Secondary | ICD-10-CM | POA: Diagnosis not present

## 2016-03-31 DIAGNOSIS — N912 Amenorrhea, unspecified: Secondary | ICD-10-CM | POA: Diagnosis not present

## 2016-03-31 NOTE — Progress Notes (Signed)
BP 119/77   Pulse 75   Temp 98.3 F (36.8 C) (Oral)   Ht 5\' 5"  (1.651 m)   Wt 168 lb 12.8 oz (76.6 kg)   LMP 02/09/2016   BMI 28.09 kg/m    Subjective:    Patient ID: Andrea Huynh, female    DOB: Mar 12, 1991, 25 y.o.   MRN: [id number]  HPI: Andrea Huynh is a 25 y.o. female presenting on 03/31/2016 for Menstrual Problem (Patient is 24 days late. Patient took home pregnancy test last Thursday and it was negative)  She spoke with her gynecology office about her cycle not coming. Her last cycle was around August 5. She states she has had some slight cramping in the past month that felt like she was, however. But she never had one. She did take a month long class where she was running and very physically active. 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 She states she has gained weight over the past year. She did get married about one year ago and has had some change in her activity. And she states she has eaten out more over the past year. But she states it is a significant amount and she was concerned about that. There is no known history of hypothyroidism for her.   Relevant past medical, surgical, family and social history reviewed and updated as indicated. Interim medical history since our last visit reviewed. Allergies and medications reviewed and updated. DATA REVIEWED: CHART IN EPIC  Social History   Social History  . Marital status: Married    Spouse name: N/A  . Number of children: N/A  . Years of education: N/A   Occupational History  . Not on file.   Social History Main Topics  . Smoking status: Never Smoker  . Smokeless tobacco: Never Used  . Alcohol use No  . Drug use: No  . Sexual activity: Not on file   Other Topics Concern  . 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    Past Surgical History:  Procedure Laterality Date  . COLONOSCOPY  08/04/2011   Procedure: COLONOSCOPY;  Surgeon: Arlyce Harman,  MD;  [facility];  Service: Endoscopy;  Laterality: N/A;  12:30  . FLEXIBLE SIGMOIDOSCOPY N/A 12/29/2013   Procedure: FLEXIBLE SIGMOIDOSCOPY;  Surgeon: West Bali, MD;  Location: [facility];  Service: Endoscopy;  Laterality: N/A;  12:15    Family History  Problem Relation Age of Onset  . Constipation    . Colon cancer Neg Hx   . Colon polyps Neg Hx   . Stomach cancer Neg Hx     Review of Systems  Constitutional: Positive for fatigue and unexpected weight change.  HENT: Negative.   Eyes: Negative.   Respiratory: Negative.   Gastrointestinal: Negative.   Genitourinary: Positive for menstrual problem. Negative for pelvic pain, vaginal bleeding, vaginal discharge and vaginal pain.      Medication List       Accurate as of 03/31/16 12:21 PM. Always use your most recent med list.          PRENATAL VITAMINS PO Take by mouth.          Objective:    BP 119/77   Pulse 75   Temp 98.3 F (36.8 C) (Oral)   Ht 5\' 5"  (1.651 m)   Wt 168 lb 12.8 oz (76.6 kg)   LMP 02/09/2016  BMI 28.09 kg/m   No Known Allergies  Wt Readings from Last 3 Encounters:  03/31/16 168 lb 12.8 oz (76.6 kg)  12/22/13 142 lb (64.4 kg)  12/17/13 142 lb 12.8 oz (64.8 kg)    Physical Exam  Constitutional: She is oriented to person, place, and time. She appears well-developed and well-nourished.  HENT:  Head: Normocephalic and atraumatic.  Eyes: Conjunctivae and EOM are normal. Pupils are equal, round, and reactive to light.  Cardiovascular: Normal rate, regular rhythm, normal heart sounds and intact distal pulses.   Pulmonary/Chest: Effort normal and breath sounds normal.  Abdominal: Soft. Bowel sounds are normal.  Neurological: She is alert and oriented to person, place, and time. She has normal reflexes.  Skin: Skin is warm and dry. No rash noted.  Psychiatric: She has a normal mood and affect. Her behavior is normal. Judgment and thought content normal.  Nursing note and  vitals reviewed.     Assessment & Plan:   1. Amenorrhea Consult with GYN if positive - Beta HCG, Quant  2. Weight gain - TSH   Continue all other maintenance medications as listed above.  Follow up plan: Follow up as needed.   Orders Placed This Encounter  Procedures  . Beta HCG, Quant  . TSH    Remus LofflerAngel S. Jaquisha Frech PA-C [facility] [address]  [place], [place] [address] [phone]   03/31/2016, 12:21 PM

## 2016-03-31 NOTE — Patient Instructions (Signed)
none

## 2016-04-01 LAB — TSH: TSH: 1.36 u[IU]/mL (ref 0.450–4.500)

## 2016-04-01 LAB — BETA HCG QUANT (REF LAB): hCG Quant: <1 m[IU]/mL

## 2016-05-29 ENCOUNTER — Telehealth: Payer: Self-pay | Admitting: Physician Assistant

## 2016-05-29 NOTE — Telephone Encounter (Signed)
Faxed Release ID [id number]

## 2016-06-09 ENCOUNTER — Other Ambulatory Visit: Payer: Self-pay | Admitting: Obstetrics and Gynecology

## 2016-07-10 NOTE — L&D Delivery Note (Signed)
Patient was C/C/+3 and pushed for 60 minutes with epidural. FHTs developed moderate variables with occ severe variables with gSTV and accels in between.  D/w pt VE assistance and risks and benefits- pt accepted. Over next 56 minutes, pt pushed with alternating VE - VE applied 4 times with 2 popoffs until crowning.    SVD  female infant, Apgars 2,7,8, weight p.  Cord gas 6.97. Neonatal in attendance.  The patient had a midline second degree episiotomy with a first degree subclitoral tear repaired with 2-0 vicryl R and 3-0 vicryl R. Fundus was firm. EBL was expected amount. Placenta was delivered intact. Vagina was clear.  Baby was eventually vigorous and doing skin to skin with mother.  Seena Face A

## 2016-11-02 DIAGNOSIS — Z23 Encounter for immunization: Secondary | ICD-10-CM | POA: Diagnosis not present

## 2016-12-28 LAB — OB RESULTS CONSOLE GBS: GBS: NEGATIVE

## 2017-01-28 ENCOUNTER — Inpatient Hospital Stay (HOSPITAL_COMMUNITY)
Admission: AD | Admit: 2017-01-28 | Discharge: 2017-01-28 | Disposition: A | Discharge status: Home / Self Care | Payer: 59 | Source: Ambulatory Visit | Attending: Obstetrics and Gynecology | Admitting: Obstetrics and Gynecology

## 2017-01-28 ENCOUNTER — Encounter (HOSPITAL_COMMUNITY): Payer: Self-pay | Admitting: *Deleted

## 2017-01-28 DIAGNOSIS — O26893 Other specified pregnancy related conditions, third trimester: Secondary | ICD-10-CM | POA: Insufficient documentation

## 2017-01-28 DIAGNOSIS — Z3689 Encounter for other specified antenatal screening: Secondary | ICD-10-CM

## 2017-01-28 DIAGNOSIS — Z0371 Encounter for suspected problem with amniotic cavity and membrane ruled out: Secondary | ICD-10-CM

## 2017-01-28 DIAGNOSIS — Z3A39 39 weeks gestation of pregnancy: Secondary | ICD-10-CM | POA: Insufficient documentation

## 2017-01-28 LAB — AMNISURE RUPTURE OF MEMBRANE (ROM) NOT AT ARMC: Amnisure ROM: NEGATIVE

## 2017-01-28 NOTE — MAU Note (Signed)
Pt reports she had a gush of fluid around 10:30 tonight. Some more leaked out a few minutes after. Reports feeling tightening of her stomach but it is not painful. Also reports that she had not felt baby move much all day.

## 2017-01-28 NOTE — MAU Note (Signed)
I have communicated with Dr. Claiborne Billingsallahan and reviewed vital signs:  Vitals:   01/28/17 0035  BP: 135/74  Pulse: (!) 118  Resp: 18  Temp: 97.8 F (36.6 C)    Vaginal exam:   ,   Also reviewed contraction pattern and that non-stress test is reactive.  It has been documented that patient is having ocasional ctx minutes with a negative amniosure test not indicating active labor or rupture of membranes.  Patient denies any other complaints.  Based on this report provider has given order for discharge.  A discharge order and diagnosis entered by a provider.   Labor discharge instructions reviewed with patient.

## 2017-01-28 NOTE — MAU Provider Note (Signed)
S:  Andrea Huynh is a 26 y.o. female G1P0 @ 5939w6d here with leaking of water. States she and her partner were in the middle of intercourse when she felt a gush of clear fluid. States she had a little bit of leaking after, however none since then. + fetal movement since arrival.   O:  GENERAL: Well-developed, well-nourished female in no acute distress.  LUNGS: Effort normal SKIN: Warm, dry and without erythema PSYCH: Normal mood and affect  Vitals:   01/28/17 0035 01/28/17 0134  BP: 135/74 129/78  Pulse: (!) 118 100  Resp: 18   Temp: 97.8 F (36.6 C)     MDM: Fetal Tracing: Baseline: 135 bpm Variability: moderate  Accelerations: 15x15 Decelerations: none Toco: occasional   Medical screen done.  RN to discuss with Dr. Claiborne Billingsallahan. Amnisure negative.   Venia Carbonasch, Vernecia Umble I, NP 01/28/2017 12:01 PM

## 2017-02-02 ENCOUNTER — Inpatient Hospital Stay (HOSPITAL_COMMUNITY): Payer: 59 | Admitting: Anesthesiology

## 2017-02-02 ENCOUNTER — Encounter (HOSPITAL_COMMUNITY): Payer: Self-pay | Admitting: *Deleted

## 2017-02-02 ENCOUNTER — Inpatient Hospital Stay (HOSPITAL_COMMUNITY)
Admission: AD | Admit: 2017-02-02 | Discharge: 2017-02-05 | DRG: 774 | Disposition: A | Discharge status: Home / Self Care | Payer: 59 | Source: Ambulatory Visit | Attending: Obstetrics and Gynecology | Admitting: Obstetrics and Gynecology

## 2017-02-02 DIAGNOSIS — O4100X0 Oligohydramnios, unspecified trimester, not applicable or unspecified: Secondary | ICD-10-CM | POA: Diagnosis present

## 2017-02-02 DIAGNOSIS — O480 Post-term pregnancy: Secondary | ICD-10-CM | POA: Diagnosis present

## 2017-02-02 DIAGNOSIS — O4103X0 Oligohydramnios, third trimester, not applicable or unspecified: Principal | ICD-10-CM | POA: Diagnosis present

## 2017-02-02 DIAGNOSIS — Z3A40 40 weeks gestation of pregnancy: Secondary | ICD-10-CM

## 2017-02-02 LAB — CBC
HCT: 36.3 % (ref 36.0–46.0)
Hemoglobin: 12.1 g/dL (ref 12.0–15.0)
MCH: 29.5 pg (ref 26.0–34.0)
MCHC: 33.3 g/dL (ref 30.0–36.0)
MCV: 88.5 fL (ref 78.0–100.0)
PLATELETS: 227 10*3/uL (ref 150–400)
RBC: 4.1 MIL/uL (ref 3.87–5.11)
RDW: 16.2 % — AB (ref 11.5–15.5)
WBC: 11 10*3/uL — AB (ref 4.0–10.5)

## 2017-02-02 LAB — TYPE AND SCREEN
ABO/RH(D): A POS
ANTIBODY SCREEN: NEGATIVE

## 2017-02-02 LAB — ABO/RH: ABO/RH(D): A POS

## 2017-02-02 MED ORDER — DIPHENHYDRAMINE HCL 50 MG/ML IJ SOLN
12.5000 mg | INTRAMUSCULAR | Status: DC | PRN
  Administered 2017-02-03: 12.5 mg via INTRAVENOUS
  Filled 2017-02-02: qty 1

## 2017-02-02 MED ORDER — FLEET ENEMA 7-19 GM/118ML RE ENEM: 1.0000 | ENEMA | RECTAL | Status: DC | PRN

## 2017-02-02 MED ORDER — LACTATED RINGERS IV SOLN
INTRAVENOUS | Status: DC
  Administered 2017-02-02 – 2017-02-03 (×3): via INTRAVENOUS

## 2017-02-02 MED ORDER — EPHEDRINE 5 MG/ML INJ
10.0000 mg | INTRAVENOUS | Status: DC | PRN
  Filled 2017-02-02: qty 2

## 2017-02-02 MED ORDER — OXYCODONE-ACETAMINOPHEN 5-325 MG PO TABS
1.0000 | ORAL_TABLET | ORAL | Status: DC | PRN
Start: 2017-02-02 — End: 2017-02-03

## 2017-02-02 MED ORDER — ACETAMINOPHEN 325 MG PO TABS: 650.0000 mg | ORAL_TABLET | ORAL | Status: DC | PRN

## 2017-02-02 MED ORDER — LIDOCAINE HCL (PF) 1 % IJ SOLN
30.0000 mL | INTRAMUSCULAR | Status: DC | PRN
  Filled 2017-02-02: qty 30

## 2017-02-02 MED ORDER — BUTORPHANOL TARTRATE 1 MG/ML IJ SOLN
INTRAMUSCULAR | Status: AC
  Administered 2017-02-02: 1 mg via INTRAVENOUS
  Filled 2017-02-02: qty 1

## 2017-02-02 MED ORDER — OXYTOCIN 40 UNITS IN LACTATED RINGERS INFUSION - SIMPLE MED
2.5000 [IU]/h | INTRAVENOUS | Status: DC
  Administered 2017-02-03 (×2): 2.5 [IU]/h via INTRAVENOUS
  Filled 2017-02-02: qty 1000

## 2017-02-02 MED ORDER — BUTORPHANOL TARTRATE 1 MG/ML IJ SOLN
1.0000 mg | INTRAMUSCULAR | Status: DC | PRN
  Administered 2017-02-02 (×2): 1 mg via INTRAVENOUS
  Filled 2017-02-02 (×2): qty 1

## 2017-02-02 MED ORDER — OXYTOCIN BOLUS FROM INFUSION: 500.0000 mL | Freq: Once | INTRAVENOUS | Status: DC

## 2017-02-02 MED ORDER — SOD CITRATE-CITRIC ACID 500-334 MG/5ML PO SOLN
30.0000 mL | ORAL | Status: DC | PRN
  Filled 2017-02-02: qty 15

## 2017-02-02 MED ORDER — TERBUTALINE SULFATE 1 MG/ML IJ SOLN
0.2500 mg | Freq: Once | INTRAMUSCULAR | Status: DC | PRN
  Filled 2017-02-02: qty 1

## 2017-02-02 MED ORDER — FENTANYL 2.5 MCG/ML BUPIVACAINE 1/10 % EPIDURAL INFUSION (WH - ANES)
14.0000 mL/h | INTRAMUSCULAR | Status: DC | PRN
  Administered 2017-02-02 – 2017-02-03 (×2): 14 mL/h via EPIDURAL
  Filled 2017-02-02 (×3): qty 100

## 2017-02-02 MED ORDER — LACTATED RINGERS IV SOLN
500.0000 mL | INTRAVENOUS | Status: DC | PRN
  Administered 2017-02-03: 500 mL via INTRAVENOUS

## 2017-02-02 MED ORDER — OXYCODONE-ACETAMINOPHEN 5-325 MG PO TABS: 2.0000 | ORAL_TABLET | ORAL | Status: DC | PRN

## 2017-02-02 MED ORDER — LACTATED RINGERS IV SOLN: 500.0000 mL | Freq: Once | INTRAVENOUS | Status: DC

## 2017-02-02 MED ORDER — ONDANSETRON HCL 4 MG/2ML IJ SOLN
4.0000 mg | Freq: Four times a day (QID) | INTRAMUSCULAR | Status: DC | PRN
  Administered 2017-02-03: 4 mg via INTRAVENOUS
  Filled 2017-02-02: qty 2

## 2017-02-02 MED ORDER — OXYTOCIN 40 UNITS IN LACTATED RINGERS INFUSION - SIMPLE MED
1.0000 m[IU]/min | INTRAVENOUS | Status: DC
Start: 2017-02-02 — End: 2017-02-03
  Administered 2017-02-02: 10 m[IU]/min via INTRAVENOUS
  Administered 2017-02-02: 6 m[IU]/min via INTRAVENOUS
  Administered 2017-02-02: 8 m[IU]/min via INTRAVENOUS
  Administered 2017-02-02: 14 m[IU]/min via INTRAVENOUS
  Administered 2017-02-02: 2 m[IU]/min via INTRAVENOUS
  Administered 2017-02-02: 16 m[IU]/min via INTRAVENOUS
  Administered 2017-02-02: 12 m[IU]/min via INTRAVENOUS
  Administered 2017-02-03: 2 m[IU]/min via INTRAVENOUS
  Filled 2017-02-02: qty 1000

## 2017-02-02 MED ORDER — PHENYLEPHRINE 40 MCG/ML (10ML) SYRINGE FOR IV PUSH (FOR BLOOD PRESSURE SUPPORT)
80.0000 ug | PREFILLED_SYRINGE | INTRAVENOUS | Status: DC | PRN
  Filled 2017-02-02 (×2): qty 10
  Filled 2017-02-02: qty 5
  Filled 2017-02-02: qty 10

## 2017-02-02 MED ORDER — PHENYLEPHRINE 40 MCG/ML (10ML) SYRINGE FOR IV PUSH (FOR BLOOD PRESSURE SUPPORT)
80.0000 ug | PREFILLED_SYRINGE | INTRAVENOUS | Status: DC | PRN
  Administered 2017-02-03 (×2): 80 ug via INTRAVENOUS
  Filled 2017-02-02: qty 10
  Filled 2017-02-02: qty 5

## 2017-02-02 NOTE — Anesthesia Preprocedure Evaluation (Signed)
Anesthesia Evaluation  Patient identified by MRN, date of birth, ID band Patient awake    Reviewed: Allergy & Precautions, NPO status , Patient's Chart, lab work & pertinent test results  Airway Mallampati: II  TM Distance: >3 FB Neck ROM: Full    Dental  (+) Teeth Intact, Dental Advisory Given   Pulmonary neg pulmonary ROS,    Pulmonary exam normal breath sounds clear to auscultation       Cardiovascular negative cardio ROS Normal cardiovascular exam Rhythm:Regular Rate:Normal     Neuro/Psych negative neurological ROS     GI/Hepatic negative GI ROS, Neg liver ROS,   Endo/Other  negative endocrine ROSObesity   Renal/GU negative Renal ROS     Musculoskeletal negative musculoskeletal ROS (+)   Abdominal   Peds  Hematology negative hematology ROS (+) Plt 227k   Anesthesia Other Findings Day of surgery medications reviewed with the patient.  Reproductive/Obstetrics (+) Pregnancy                             Anesthesia Physical Anesthesia Plan  ASA: II  Anesthesia Plan: Epidural   Post-op Pain Management:    Induction:   PONV Risk Score and Plan: Treatment may vary due to age or medical condition  Airway Management Planned:   Additional Equipment:   Intra-op Plan:   Post-operative Plan:   Informed Consent: I have reviewed the patients History and Physical, chart, labs and discussed the procedure including the risks, benefits and alternatives for the proposed anesthesia with the patient or authorized representative who has indicated his/her understanding and acceptance.   Dental advisory given  Plan Discussed with:   Anesthesia Plan Comments: (Patient identified. Risks/Benefits/Options discussed with patient including but not limited to bleeding, infection, nerve damage, paralysis, failed block, incomplete pain control, headache, blood pressure changes, nausea, vomiting,  reactions to medication both or allergic, itching and postpartum back pain. Confirmed with bedside nurse the patient's most recent platelet count. Confirmed with patient that they are not currently taking any anticoagulation, have any bleeding history or any family history of bleeding disorders. Patient expressed understanding and wished to proceed. All questions were answered. )        Anesthesia Quick Evaluation

## 2017-02-02 NOTE — H&P (Signed)
26 y.o. 7756w4d  G1P0 comes in for induction for oligohydramnios at post term.  Pt c/o decreased FM today and despite good NST, we did a BPP/AFI.  BPP was 8/10, two off for fluid.  AFP is zero.  Bowel looks echogenic.  Otherwise has not had LOF and no bleeding.  Past Medical History:  Diagnosis Date  . Abdominal pain, RLQ 2010  . Gastroparesis JUN 2015 SMART PILL   NO SYMPTOMS  . 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 COLONOSCOPY  08/04/2011   Procedure: COLONOSCOPY;  Surgeon: Arlyce HarmanSandi M Fields, MD;  Location: [facility];  Service: Endoscopy;  Laterality: N/A;  12:30  . FLEXIBLE SIGMOIDOSCOPY N/A 12/29/2013   Procedure: FLEXIBLE SIGMOIDOSCOPY;  Surgeon: West BaliSandi L Fields, MD;  [facility]: [facility];  Service: Endoscopy;  Laterality: N/A;  12:15    OB History  Gravida Para Term Preterm AB Living  1            SAB TAB Ectopic Multiple Live Births               # Outcome Date GA Lbr Len/2nd Weight Sex Delivery Anes PTL Lv  1 Current               Social History   Social History  . Marital status: Married    Spouse name: N/A  . Number of children: N/A  . Years of education: N/A   Occupational History  . Not on file.   Social History Main Topics  . Smoking status: Never Smoker  . Smokeless tobacco: Never Used  . Alcohol use No  . Drug use: No  . Sexual activity: Not on file   Other Topics Concern  . 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   Patient has no known allergies.    Prenatal Transfer Tool  Maternal Diabetes: No Genetic Screening: Normal Maternal Ultrasounds/Referrals: Normal Fetal Ultrasounds or other Referrals:  None Maternal Substance Abuse:  No Significant Maternal Medications:  None Significant Maternal Lab Results: None  Other PNC: uncomplicated to this point    Vitals:   02/02/17 1346 02/02/17 1425  BP:  120/81 138/76  Pulse: 92 93  Resp: 20   Temp:       Lungs/Cor:  NAD Abdomen:  soft, gravid Ex:  no cords, erythema SVE:  In office 2/50/-2 FHTs:  In office 130s, good STV, NST R Toco:  qocc   A/P   Post term pregnancy with oligohydramnios- sero fluid.  For induction with pit.  GBS neg  Tedd Cottrill A

## 2017-02-02 NOTE — Anesthesia Pain Management Evaluation Note (Signed)
  CRNA Pain Management Visit Note  Patient: Andrea Huynh, 26 y.o., female  "Hello I am a member of the anesthesia team at [facility]. We have an anesthesia team available at all times to provide care throughout the hospital, including epidural management and anesthesia for C-section. I don't know your plan for the delivery whether it a natural birth, water birth, IV sedation, nitrous supplementation, doula or epidural, but we want to meet your pain goals."   1.Was your pain managed to your expectations on prior hospitalizations?   No prior hospitalizations  2.What is your expectation for pain management during this hospitalization?     Epidural  3.How can we help you reach that goal?   Record the patient's initial score and the patient's pain goal.   Pain: 2  Pain Goal: 6 [facility] wants you to be able to say your pain was always managed very well.  Laban EmperorMalinova,Sylvie Mifsud Hristova 02/02/2017

## 2017-02-03 ENCOUNTER — Encounter (HOSPITAL_COMMUNITY): Payer: Self-pay

## 2017-02-03 ENCOUNTER — Encounter (HOSPITAL_COMMUNITY)
Admission: AD | Disposition: A | Discharge status: Home / Self Care | Payer: Self-pay | Source: Ambulatory Visit | Attending: Obstetrics and Gynecology

## 2017-02-03 LAB — RPR: RPR Ser Ql: NONREACTIVE

## 2017-02-03 SURGERY — DILATION AND EVACUATION, UTERUS
Anesthesia: General

## 2017-02-03 MED ORDER — SENNOSIDES-DOCUSATE SODIUM 8.6-50 MG PO TABS
2.0000 | ORAL_TABLET | ORAL | Status: DC
  Administered 2017-02-03 – 2017-02-05 (×2): 2 via ORAL
  Filled 2017-02-03 (×2): qty 2

## 2017-02-03 MED ORDER — SODIUM CHLORIDE 0.9 % IV SOLN
2.0000 g | Freq: Four times a day (QID) | INTRAVENOUS | Status: DC
  Administered 2017-02-03: 2 g via INTRAVENOUS
  Filled 2017-02-03 (×3): qty 2000

## 2017-02-03 MED ORDER — DIBUCAINE 1 % RE OINT: 1.0000 "application " | TOPICAL_OINTMENT | RECTAL | Status: DC | PRN

## 2017-02-03 MED ORDER — IBUPROFEN 800 MG PO TABS
800.0000 mg | ORAL_TABLET | Freq: Three times a day (TID) | ORAL | Status: DC
  Administered 2017-02-03 – 2017-02-05 (×5): 800 mg via ORAL
  Filled 2017-02-03 (×6): qty 1

## 2017-02-03 MED ORDER — LACTATED RINGERS IV SOLN
INTRAVENOUS | Status: DC
  Administered 2017-02-03 (×2): via INTRAUTERINE

## 2017-02-03 MED ORDER — MAGNESIUM HYDROXIDE 400 MG/5ML PO SUSP: 30.0000 mL | ORAL | Status: DC | PRN

## 2017-02-03 MED ORDER — PRENATAL MULTIVITAMIN CH
1.0000 | ORAL_TABLET | Freq: Every day | ORAL | Status: DC
  Administered 2017-02-04 – 2017-02-05 (×2): 1 via ORAL
  Filled 2017-02-03: qty 1

## 2017-02-03 MED ORDER — COCONUT OIL OIL: 1.0000 "application " | TOPICAL_OIL | Status: DC | PRN

## 2017-02-03 MED ORDER — METHYLERGONOVINE MALEATE 0.2 MG/ML IJ SOLN
INTRAMUSCULAR | Status: AC
  Administered 2017-02-03: 0.2 mg via INTRAMUSCULAR
  Filled 2017-02-03: qty 1

## 2017-02-03 MED ORDER — BUTORPHANOL TARTRATE 1 MG/ML IJ SOLN
1.0000 mg | Freq: Once | INTRAMUSCULAR | Status: AC
  Administered 2017-02-03: 1 mg via INTRAVENOUS

## 2017-02-03 MED ORDER — MEASLES, MUMPS & RUBELLA VAC ~~LOC~~ INJ
0.5000 mL | INJECTION | Freq: Once | SUBCUTANEOUS | Status: AC
  Administered 2017-02-05: 0.5 mL via SUBCUTANEOUS
  Filled 2017-02-03 (×2): qty 0.5

## 2017-02-03 MED ORDER — LIDOCAINE HCL (PF) 1 % IJ SOLN
INTRAMUSCULAR | Status: DC | PRN
  Administered 2017-02-02: 3 mL via EPIDURAL
  Administered 2017-02-02: 2 mL via EPIDURAL
  Administered 2017-02-02: 5 mL via EPIDURAL

## 2017-02-03 MED ORDER — SODIUM CHLORIDE 0.9% FLUSH: 3.0000 mL | INTRAVENOUS | Status: DC | PRN

## 2017-02-03 MED ORDER — SIMETHICONE 80 MG PO CHEW: 80.0000 mg | CHEWABLE_TABLET | ORAL | Status: DC | PRN

## 2017-02-03 MED ORDER — ACETAMINOPHEN 500 MG PO TABS
1000.0000 mg | ORAL_TABLET | Freq: Once | ORAL | Status: AC
  Administered 2017-02-03: 1000 mg via ORAL
  Filled 2017-02-03: qty 2

## 2017-02-03 MED ORDER — ONDANSETRON HCL 4 MG PO TABS: 4.0000 mg | ORAL_TABLET | ORAL | Status: DC | PRN

## 2017-02-03 MED ORDER — SODIUM CHLORIDE 0.9 % IV SOLN: 250.0000 mL | INTRAVENOUS | Status: DC | PRN

## 2017-02-03 MED ORDER — METHYLERGONOVINE MALEATE 0.2 MG/ML IJ SOLN: 0.2000 mg | INTRAMUSCULAR | Status: DC | PRN

## 2017-02-03 MED ORDER — METHYLERGONOVINE MALEATE 0.2 MG PO TABS: 0.2000 mg | ORAL_TABLET | ORAL | Status: DC | PRN

## 2017-02-03 MED ORDER — WITCH HAZEL-GLYCERIN EX PADS: 1.0000 "application " | MEDICATED_PAD | CUTANEOUS | Status: DC | PRN

## 2017-02-03 MED ORDER — DIPHENHYDRAMINE HCL 25 MG PO CAPS
25.0000 mg | ORAL_CAPSULE | Freq: Four times a day (QID) | ORAL | Status: DC | PRN
Start: 2017-02-03 — End: 2017-02-05

## 2017-02-03 MED ORDER — SODIUM BICARBONATE 8.4 % IV SOLN
INTRAVENOUS | Status: DC | PRN
  Administered 2017-02-03 (×3): 5 mL via EPIDURAL

## 2017-02-03 MED ORDER — SODIUM CHLORIDE 0.9% FLUSH: 3.0000 mL | Freq: Two times a day (BID) | INTRAVENOUS | Status: DC

## 2017-02-03 MED ORDER — OXYCODONE-ACETAMINOPHEN 5-325 MG PO TABS: 1.0000 | ORAL_TABLET | ORAL | Status: DC | PRN

## 2017-02-03 MED ORDER — OXYCODONE-ACETAMINOPHEN 5-325 MG PO TABS: 2.0000 | ORAL_TABLET | ORAL | Status: DC | PRN

## 2017-02-03 MED ORDER — METHYLERGONOVINE MALEATE 0.2 MG/ML IJ SOLN
0.2000 mg | Freq: Once | INTRAMUSCULAR | Status: AC
  Administered 2017-02-03: 0.2 mg via INTRAMUSCULAR

## 2017-02-03 MED ORDER — BENZOCAINE-MENTHOL 20-0.5 % EX AERO
1.0000 "application " | INHALATION_SPRAY | CUTANEOUS | Status: DC | PRN
  Administered 2017-02-04: 1 via TOPICAL
  Filled 2017-02-03: qty 56

## 2017-02-03 MED ORDER — ACETAMINOPHEN 325 MG PO TABS: 650.0000 mg | ORAL_TABLET | ORAL | Status: DC | PRN

## 2017-02-03 MED ORDER — ONDANSETRON HCL 4 MG/2ML IJ SOLN: 4.0000 mg | INTRAMUSCULAR | Status: DC | PRN

## 2017-02-03 MED ORDER — ZOLPIDEM TARTRATE 5 MG PO TABS
5.0000 mg | ORAL_TABLET | Freq: Every evening | ORAL | Status: DC | PRN
Start: 2017-02-03 — End: 2017-02-05

## 2017-02-03 MED ORDER — FERROUS SULFATE 325 (65 FE) MG PO TABS
325.0000 mg | ORAL_TABLET | Freq: Two times a day (BID) | ORAL | Status: DC
  Administered 2017-02-04 – 2017-02-05 (×3): 325 mg via ORAL
  Filled 2017-02-03 (×3): qty 1

## 2017-02-03 MED ORDER — TETANUS-DIPHTH-ACELL PERTUSSIS 5-2.5-18.5 LF-MCG/0.5 IM SUSP
0.5000 mL | Freq: Once | INTRAMUSCULAR | Status: DC
Start: 2017-02-04 — End: 2017-02-05

## 2017-02-03 MED ORDER — PHENYLEPHRINE 40 MCG/ML (10ML) SYRINGE FOR IV PUSH (FOR BLOOD PRESSURE SUPPORT)
PREFILLED_SYRINGE | INTRAVENOUS | Status: AC
  Filled 2017-02-03: qty 30

## 2017-02-03 MED ORDER — LACTATED RINGERS IV SOLN: 500.0000 mL | Freq: Once | INTRAVENOUS | Status: DC

## 2017-02-03 NOTE — Progress Notes (Signed)
1825 dr Henderson CloudHorvath updated on FF, bleeding sm-mod, BP stable but pulse 130-140 and pt c/o abdominal cramping. Ok to give ibuprofen. To continue  IV, foley postpartum. Monitor pt for 1 more hour on labor and delivery. Declined CBC for now. RN to call dr Henderson CloudHorvath in 1 hour [phone] with updated report for transfer order.FOB holding baby skin to skin.

## 2017-02-03 NOTE — Progress Notes (Signed)
Dr Henderson CloudHorvath notified of last SVE, pt c/o shivering, last temp 99.2, pt vomited, notified of no return from amnioinfusion, now off, fhr baseline increased to 155. Vbls with vomiting or postion change only. Tachysystole that occurs intermittently with 8mu pitocin. OK to keep pitocin at current dose. No further orders.

## 2017-02-03 NOTE — Progress Notes (Signed)
CTSP   Pt c/o not feeling well and BP dropped to 90/50.  Color still good and pt awake and oriented but pulse up as well.  Pt was not bleeding from vagina.  Vitals:   02/03/17 1631 02/03/17 1636 02/03/17 1638 02/03/17 1641  BP: 97/62 105/68 113/74 (!) 106/58  Pulse: (!) 124 (!) 129 (!) 126 (!) 122  Resp: 18     Temp:      TempSrc:      SpO2: 99%     Weight:      Height:       Vaginal exam revealed enlarged pp uterus with clot.  Some clot removed- about 700cc by weight.  Pt uncomfortable with exam.    Stadol given. Anesthesia evaluated pt and with neo at bedside was able to give epidural anesthesia again.    Uterine exploration removed a 5 cm piece of placenta and some membranes.  Two further sweeps were negative for any clot or other membranes.    Bleeding and lochia now minimal.  Sutures of vagina intact although slightly stretched.    Will watch for further bleeding.  Total blood loss from delivery and pp is 850 cc.

## 2017-02-03 NOTE — Progress Notes (Addendum)
1609 pt c/o feeling lightheaded, fundus firm, at umbilicus, bleeding small. Cycling BP, giving baby to dad. Calling [facility] to help with patient and charge RN updated.

## 2017-02-03 NOTE — Anesthesia Procedure Notes (Signed)
Epidural Patient location during procedure: OB Start time: 02/02/2017 11:37 PM End time: 02/02/2017 11:42 PM  Staffing Anesthesiologist: Cecile HearingURK, Vickey Ewbank EDWARD Performed: anesthesiologist   Preanesthetic Checklist Completed: patient identified, pre-op evaluation, timeout performed, IV checked, risks and benefits discussed and monitors and equipment checked  Epidural Patient position: sitting Prep: DuraPrep Patient monitoring: blood pressure and continuous pulse ox Approach: midline Location: L3-L4 Injection technique: LOR air  Needle:  Needle type: Tuohy  Needle gauge: 17 G Needle length: 9 cm Needle insertion depth: 6 cm Catheter size: 19 Gauge Catheter at skin depth: 11 cm Test dose: negative and Other (1% Lidocaine)  Additional Notes Patient identified.  Risk benefits discussed including failed block, incomplete pain control, headache, nerve damage, paralysis, blood pressure changes, nausea, vomiting, reactions to medication both toxic or allergic, and postpartum back pain.  Patient expressed understanding and wished to proceed.  All questions were answered.  Sterile technique used throughout procedure and epidural site dressed with sterile barrier dressing. No paresthesia or other complications noted. The patient did not experience any signs of intravascular injection such as tinnitus or metallic taste in mouth nor signs of intrathecal spread such as rapid motor block. Please see nursing notes for vital signs. Reason for block:procedure for pain

## 2017-02-03 NOTE — Progress Notes (Signed)
Pt comfortable with epidural.  FHTs 130-140s, gSTV, NST R.  Pt had a run of three severe variables that then went to mild to moderate variables.  Now very mild variables after IUPC replaced and FSE placed  Toco q695min  SVE 4-5/C/-2, still no fluid seen  A/P  Continue induction.  Amni-infusion started.

## 2017-02-03 NOTE — Progress Notes (Signed)
Pt comfortable but having some sweating.  Temp max 102- started Amp and Tylenol  FHTs 160s, GSTV, accels, occ moderate variables Toco q 3 SVE C/C/+3  Begin pushing.

## 2017-02-04 LAB — CBC
HEMATOCRIT: 26.4 % — AB (ref 36.0–46.0)
HEMOGLOBIN: 8.8 g/dL — AB (ref 12.0–15.0)
MCH: 29.8 pg (ref 26.0–34.0)
MCHC: 33.3 g/dL (ref 30.0–36.0)
MCV: 89.5 fL (ref 78.0–100.0)
Platelets: 189 10*3/uL (ref 150–400)
RBC: 2.95 MIL/uL — ABNORMAL LOW (ref 3.87–5.11)
RDW: 16.8 % — AB (ref 11.5–15.5)
WBC: 27.9 10*3/uL — AB (ref 4.0–10.5)

## 2017-02-04 MED ORDER — LACTATED RINGERS IV SOLN
Freq: Once | INTRAVENOUS | Status: AC
  Administered 2017-02-04: 07:00:00 via INTRAVENOUS

## 2017-02-04 MED ORDER — OXYTOCIN 40 UNITS IN LACTATED RINGERS INFUSION - SIMPLE MED
2.5000 [IU]/h | INTRAVENOUS | Status: DC
  Administered 2017-02-04: 2.5 [IU]/h via INTRAVENOUS
  Filled 2017-02-04: qty 1000

## 2017-02-04 NOTE — Lactation Note (Signed)
This note was copied from a baby's chart. Lactation Consultation Note  Patient Name: Andrea Huynh'XToday's Date: 02/04/2017 Reason for consult: Initial assessment Baby at 22 hr of life and mom reports baby is not latching. Baby has noticeable anterior lingual frenulum with a heart shaped tongue tip. Baby can not lift tongue to mid line, can not extend tongue over gum ridge, has no lateralization of tongue, has poor peristolic tongue movement, can only cup the L side of the tongue, he mostly bites on a gloved finger. Baby has a small fat pad on the base of his neck/top center of the shoulders. He keeps his chin to his chest. Applied #20 NS and baby was able to maintain short bursts of sucking. No swallows were noted and no colostrum was seen in the NS. Used a 5 Fr at the breast with Alimentum and baby was able to stay on the breast for 15 minutes and took in 10 ml of formula. Discussed baby behavior, feeding frequency, pumping, supplementing volumes, formula handling, baby belly size, voids, wt loss, breast changes, and nipple care. Demonstrated manual expression, no colostrum noted bilaterally. Given lactation handouts. Aware of OP services and support group. Mom will offer the breast on demand and supplement as needed per volume guidelines.  Report given to RN.      Maternal Data Has patient been taught Hand Expression?: Yes Does the patient have breastfeeding experience prior to this delivery?: No  Feeding Feeding Type: Breast Fed Length of feed: 15 min  LATCH Score/Interventions Latch: Repeated attempts needed to sustain latch, nipple held in mouth throughout feeding, stimulation needed to elicit sucking reflex. Intervention(s): Adjust position;Assist with latch;Breast compression  Audible Swallowing: A few with stimulation Intervention(s): Skin to skin;Hand expression  Type of Nipple: Everted at rest and after stimulation  Comfort (Breast/Nipple): Soft / non-tender     Hold  (Positioning): Full assist, staff holds infant at breast Intervention(s): Position options;Support Pillows  LATCH Score: 6  Lactation Tools Discussed/Used Tools: 46F feeding tube / Syringe;Nipple Shields Nipple shield size: 20   Consult Status Consult Status: Follow-up Date: 02/05/17 Follow-up type: In-patient    Andrea Huynh 02/04/2017, 12:50 PM

## 2017-02-04 NOTE — Progress Notes (Signed)
Patient is eating, ambulating, voiding.  Pain control is good.  Vitals:   02/03/17 1947 02/03/17 2015 02/03/17 2117 02/04/17 0210  BP: 125/81 129/66 113/62 (!) 104/51  Pulse: (!) 115 (!) 112 (!) 110 (!) 104  Resp: '20 18 18 18  ' Temp:  98.7 F (37.1 C) 98.8 F (37.1 C) 98.2 F (36.8 C)  TempSrc:  Axillary Oral Oral  SpO2:  98% 99% 100%  Weight:      Height:        Fundus firm Perineum without swelling.  Lab Results  Component Value Date   WBC 27.9 (H) 02/04/2017   HGB 8.8 (L) 02/04/2017   HCT 26.4 (L) 02/04/2017   MCV 89.5 02/04/2017   PLT 189 02/04/2017    --/--/A POS, A POS (07/27 1143)/R NON immune  A/P Post partum day 1.  PP hemorrhage- H/H is stable.  Anemia precautions.  Routine care.  Expect d/c tomorrow.  MMR.  Lenox Ladouceur A

## 2017-02-04 NOTE — Progress Notes (Signed)
Baby's temperatures and glucose being watched.  No circ today.

## 2017-02-04 NOTE — Anesthesia Postprocedure Evaluation (Signed)
Anesthesia Post Note  Patient: Andrea Huynh  Procedure(s) Performed: * No procedures listed *     Patient location during evaluation: Mother Baby Anesthesia Type: Epidural Level of consciousness: awake, awake and alert, oriented and patient cooperative Pain management: pain level controlled Vital Signs Assessment: post-procedure vital signs reviewed and stable Respiratory status: spontaneous breathing, nonlabored ventilation and respiratory function stable Cardiovascular status: stable Postop Assessment: no headache, no backache, patient able to bend at knees and no signs of nausea or vomiting Anesthetic complications: no    Last Vitals:  Vitals:   02/03/17 2117 02/04/17 0210  BP: 113/62 (!) 104/51  Pulse: (!) 110 (!) 104  Resp: 18 18  Temp: 37.1 C 36.8 C    Last Pain:  Vitals:   02/04/17 0320  TempSrc:   PainSc: Asleep   Pain Goal: Patients Stated Pain Goal: 1 (02/04/17 0210)               Aneka Fagerstrom L

## 2017-02-04 NOTE — Progress Notes (Signed)
CSW received consult for hx of Anxiety and Depression.  CSW met with MOB to offer support and complete assessment.    Upon this writers arrival, MOB informed this writer that this dx was not a new one but an old given several years ago. MOB notes she is no longer experiencing symptoms and believes it is well managed.   CSW provided education regarding the baby blues period vs. perinatal mood disorders, discussed treatment and gave resources for mental health follow up if concerns arise.  CSW recommends self-evaluation during the postpartum time period using the New Mom Checklist from Postpartum Progress and encouraged MOB to contact a medical professional if symptoms are noted at any time.   CSW provided review of Sudden Infant Death Syndrome (SIDS) precautions.   CSW identifies no further need for intervention and no barriers to discharge at this time.   , MSW, LCSW-A Clinical Social Worker  [facility]  Office: [phone]  

## 2017-02-05 MED ORDER — IBUPROFEN 600 MG PO TABS: 600.0000 mg | ORAL_TABLET | Freq: Four times a day (QID) | ORAL | 0 refills | Status: DC | PRN

## 2017-02-05 NOTE — Progress Notes (Signed)
Patient is eating, ambulating, voiding.  Pain control is good.  Lochia is appropriate H/o PPH--hg 8.8 yesterday.  Pt denies dizziness or feeling lightheaded w ambulation  Vitals:   02/04/17 0210 02/04/17 0915 02/04/17 1803 02/05/17 0540  BP: (!) 104/51 109/63 131/84 (!) 113/55  Pulse: (!) 104 (!) 109 (!) 112 (!) 103  Resp: '18 18 16 18  ' Temp: 98.2 F (36.8 C) 98.4 F (36.9 C) 98.7 F (37.1 C) 98.1 F (36.7 C)  TempSrc: Oral Oral Oral Oral  SpO2: 100% 98% 99% 98%  Weight:      Height:        NAD Abd soft, fundus firm Ext:   Lab Results  Component Value Date   WBC 27.9 (H) 02/04/2017   HGB 8.8 (L) 02/04/2017   HCT 26.4 (L) 02/04/2017   MCV 89.5 02/04/2017   PLT 189 02/04/2017    --/--/A POS, A POS (07/27 1143)/R NON immune  A/P  g1p1 PPD#1 s/pp SVD c/b PPH PPH- H/H is stable.  Mild tachycardia this AM, but no symptoms w ambulation and lochia is appropriate RNI--MMR prior to d/c Meeting all goals-will d/c to home today  Desires circ--baby under bili lights--will need to defer until phototherapy is complete   [place]var[place]

## 2017-02-05 NOTE — Progress Notes (Signed)
Desires circumcision. Discussed r/b/a of the procedure. Reviewed that circumcision is an elective surgical procedure and not considered medically necessary. Reviewed the risks of the procedure including the risk of infection, bleeding, damage to surrounding structures, including scrotum, shaft, urethra and head of penis, and an undesired cosmetic effect requiring additional procedures for revision. Consent signed.   Patient with short shaft to penis--examined and felt to be sufficient tissue to proceed with circumcision

## 2017-02-05 NOTE — Discharge Summary (Signed)
Obstetric Discharge Summary Reason for Admission: induction of labor Prenatal Procedures: ultrasound--oligohydramnios Intrapartum Procedures: spontaneous vaginal delivery and episiotomy midline Postpartum Procedures: MMR prior to d/c Complications-Operative and Postpartum: 2 degree perineal laceration and hemorrhage Hemoglobin  Date Value Ref Range Status  02/04/2017 8.8 (L) 12.0 - 15.0 g/dL Final    Comment:    DELTA CHECK NOTED REPEATED TO VERIFY    HCT  Date Value Ref Range Status  02/04/2017 26.4 (L) 36.0 - 46.0 % Final    Physical Exam:  General: alert, cooperative and appears stated age 3: appropriate Uterine Fundus: firm DVT Evaluation: No evidence of DVT seen on physical exam. Negative Homan's sign.  Discharge Diagnoses: Term Pregnancy-delivered and oligohydramnios  Discharge Information: Date: 02/05/2017 Activity: pelvic rest Diet: routine Medications: PNV and Ibuprofen and iron Condition: stable Instructions: refer to practice specific booklet Discharge to: home Follow-up Information    Bobbye Charleston, MD Follow up in 4 week(s).   Specialty:  Obstetrics and Gynecology Contact information: [address] [place] [address] [phone]           Newborn Data: Live born female  Birth Weight: 6 lb 10.7 oz (3025 g) APGAR: 2, 7  Home with mother.  Brooks 02/05/2017, 9:17 AM

## 2017-02-05 NOTE — Lactation Note (Signed)
This note was copied from a baby's chart. Lactation Consultation Note  Patient Name: Boy Enzo Montgomerymbra Mcafee WUJWJ'XToday's Date: 02/05/2017 Reason for consult: Follow-up assessment;Hyperbilirubinemia;Difficult latch  Baby 49 hours old. Baby on double phototherapy. Parents report that baby circumcised earlier and would not latch well when he returned from procedure, so they gave him 10 ml of formula by bottle. Offered to assist with latching baby, but mom states that she is comfortable with positioning and latching the baby. [facility], but parents declined. Mom reports that she will call after the baby is seen by pediatrician tomorrow (02/06/17. Parents intend to discuss baby's tight frenulum with pediatrician, and then decide from there what to do. Discuss with mom the need to keep pumping as long as she is using NS, and mom reports that she has a personal pump. Discussed the benefits of hospital-grade pump and mom aware of rental program. Mom reports that she will compare the effectiveness of her personal pump and decide.   Plan is for mom to continue to put baby to breast with cues and at least every 3 hours, then supplement with EBM/formula according to supplementation guidelines--which parents have. Enc mom to post-pump followed by hand expression. Mom states that she may decide to pump and bottle-feed EBM. Discussed progression of milk coming to volume. Parents aware of OP/BFSG and LC phone line assistance after D/C.   Maternal Data    Feeding Feeding Type: Formula Nipple Type: Slow - flow  LATCH Score                   Interventions    Lactation Tools Discussed/Used Tools: Pump;Nipple Shields Nipple shield size: 20 Breast pump type: Double-Electric Breast Pump   Consult Status Consult Status: PRN    Sherlyn HayJennifer D [place] 02/05/2017, 3:48 PM

## 2017-02-08 DIAGNOSIS — N390 Urinary tract infection, site not specified: Secondary | ICD-10-CM | POA: Diagnosis not present

## 2017-02-08 DIAGNOSIS — R10814 Left lower quadrant abdominal tenderness: Secondary | ICD-10-CM | POA: Diagnosis not present

## 2017-02-08 DIAGNOSIS — R509 Fever, unspecified: Secondary | ICD-10-CM | POA: Diagnosis not present

## 2017-02-08 DIAGNOSIS — K2970 Gastritis, unspecified, without bleeding: Secondary | ICD-10-CM | POA: Diagnosis not present

## 2017-02-08 DIAGNOSIS — R1032 Left lower quadrant pain: Secondary | ICD-10-CM | POA: Diagnosis not present

## 2017-02-09 DIAGNOSIS — R1032 Left lower quadrant pain: Secondary | ICD-10-CM | POA: Diagnosis not present

## 2017-02-11 ENCOUNTER — Inpatient Hospital Stay (HOSPITAL_COMMUNITY): Payer: 59

## 2017-02-11 ENCOUNTER — Inpatient Hospital Stay (HOSPITAL_COMMUNITY)
Admission: AD | Admit: 2017-02-11 | Discharge: 2017-02-11 | Disposition: A | Discharge status: Home / Self Care | Payer: 59 | Source: Ambulatory Visit | Attending: Obstetrics and Gynecology | Admitting: Obstetrics and Gynecology

## 2017-02-11 ENCOUNTER — Encounter (HOSPITAL_COMMUNITY): Payer: Self-pay | Admitting: *Deleted

## 2017-02-11 DIAGNOSIS — R109 Unspecified abdominal pain: Secondary | ICD-10-CM | POA: Insufficient documentation

## 2017-02-11 DIAGNOSIS — O9089 Other complications of the puerperium, not elsewhere classified: Secondary | ICD-10-CM | POA: Insufficient documentation

## 2017-02-11 DIAGNOSIS — R102 Pelvic and perineal pain: Secondary | ICD-10-CM | POA: Diagnosis not present

## 2017-02-11 DIAGNOSIS — R52 Pain, unspecified: Secondary | ICD-10-CM

## 2017-02-11 LAB — URINALYSIS, ROUTINE W REFLEX MICROSCOPIC
Bilirubin Urine: NEGATIVE
Glucose, UA: NEGATIVE mg/dL
KETONES UR: NEGATIVE mg/dL
Nitrite: NEGATIVE
PH: 6 (ref 5.0–8.0)
Protein, ur: NEGATIVE mg/dL
SPECIFIC GRAVITY, URINE: 1.013 (ref 1.005–1.030)

## 2017-02-11 LAB — CBC WITH DIFFERENTIAL/PLATELET
Basophils Absolute: 0.1 10*3/uL (ref 0.0–0.1)
Basophils Relative: 1 %
EOS ABS: 0.2 10*3/uL (ref 0.0–0.7)
Eosinophils Relative: 3 %
HCT: 22.7 % — ABNORMAL LOW (ref 36.0–46.0)
HEMOGLOBIN: 7.4 g/dL — AB (ref 12.0–15.0)
LYMPHS PCT: 17 %
Lymphs Abs: 1.4 10*3/uL (ref 0.7–4.0)
MCH: 28.7 pg (ref 26.0–34.0)
MCHC: 32.6 g/dL (ref 30.0–36.0)
MCV: 88 fL (ref 78.0–100.0)
MONO ABS: 0.2 10*3/uL (ref 0.1–1.0)
MONOS PCT: 3 %
Neutro Abs: 6.1 10*3/uL (ref 1.7–7.7)
Neutrophils Relative %: 76 %
Other: 0 %
PLATELETS: 301 10*3/uL (ref 150–400)
RBC: 2.58 MIL/uL — AB (ref 3.87–5.11)
RDW: 15.9 % — ABNORMAL HIGH (ref 11.5–15.5)
WBC: 8 10*3/uL (ref 4.0–10.5)

## 2017-02-11 MED ORDER — OXYCODONE-ACETAMINOPHEN 5-325 MG PO TABS: 1.0000 | ORAL_TABLET | Freq: Four times a day (QID) | ORAL | 0 refills | Status: DC | PRN

## 2017-02-11 NOTE — Discharge Instructions (Signed)
Return to care   If you have heavier bleeding that soaks through more that 2 pads per hour for an hour or more  If you bleed so much that you feel like you might pass out or you do pass out  If you have significant abdominal pain that is not improved with pain medication  If you develop a fever > 100.5

## 2017-02-11 NOTE — MAU Note (Signed)
Vag del 02/03/17. Had retained placenta that was manually removed. Started on Keflex 500 QID on 8/2 by Dr Henderson CloudHorvath due to LLQ pain and ? Infection. Tonight pain is on RLQ. Took Ibuprofen 600mg  2330 which helped alittle but still having pain. Vag bleeding heavy in the past but less on Sat. No odor to bleeding.

## 2017-02-11 NOTE — MAU Provider Note (Signed)
History     CSN: [id number]  Arrival date and time: 02/11/17 0039  First Provider Initiated Contact with Patient 02/11/17 0107      Chief Complaint  Patient presents with  . Abdominal Pain   HPI Andrea Huynh is a 26 y.o. G29P1001 female who presents with abdominal pain. Patient is 1 week s/p SVD. At time of delivery had PPH & retained placenta that was manually removed. Patient was seen at [facility] 4 days ago for abdominal pain & fever; was discharged home on keflex per recommendation from Dr. Henderson Cloud. Was seen in office 2 days ago for follow up; reports normal exam & told to continue antibiotics. States she had a fever 2 nights ago & none since. Earlier this week pain was in LLQ & resolved with abx. Pain returned tonight in the RLQ. Describes as constant cramp/sharp pain. Rates pain 5/10 after taking a dose of ibuprofen. Prior to medication, pain was 10/10. Walking & moving makes pain worse. States since the delivery has had "heavy bleeding". Per her husband, patient has woken up in the night with dark red blood that has soaked through her clothes. States bleeding somewhat light today; not saturating pad or passing blood clots. Denies n/v/d, constipation. Last BM was this morning.   OB History    Gravida Para Term Preterm AB Living   1 1 1     1    SAB TAB Ectopic Multiple Live Births         0 1      Past Medical History:  Diagnosis Date  . Abdominal pain, RLQ 2010  . Gastroparesis JUN 2015 SMART PILL   NO SYMPTOMS  . 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 COLONOSCOPY  08/04/2011   Procedure: COLONOSCOPY;  Surgeon: Arlyce Harman, MD;  [facility];  Service: Endoscopy;  Laterality: N/A;  12:30  . FLEXIBLE SIGMOIDOSCOPY N/A 12/29/2013   Procedure: FLEXIBLE SIGMOIDOSCOPY;  Surgeon: West Bali, MD;  [facility];  Service: Endoscopy;  Laterality:  N/A;  12:15    Family History  Problem Relation Age of Onset  . Constipation Unknown   . Colon cancer Neg Hx   . Colon polyps Neg Hx   . Stomach cancer Neg Hx     Social History  Substance Use Topics  . Smoking status: Never Smoker  . Smokeless tobacco: Never Used  . Alcohol use No    Allergies: No Known Allergies  Prescriptions Prior to Admission  Medication Sig Dispense Refill Last Dose  . ibuprofen (ADVIL,MOTRIN) 600 MG tablet Take 1 tablet (600 mg total) by mouth every 6 (six) hours as needed. 40 tablet 0   . Prenatal Multivit-Min-Fe-FA (PRENATAL VITAMINS PO) Take 1 tablet by mouth daily.    02/02/2017 at Unknown time    Review of Systems  Constitutional: Positive for fever. Negative for chills.  Gastrointestinal: Positive for abdominal pain and nausea. Negative for constipation, diarrhea and vomiting.  Genitourinary: Positive for vaginal bleeding. Negative for dysuria.   Physical Exam   Blood pressure 127/83, pulse 72, temperature 98.1 F (36.7 C), temperature source Oral, resp. rate 20, SpO2 100 %, currently breastfeeding.   Patient Vitals for the past 24 hrs:  BP Temp Temp src Pulse Resp SpO2  02/11/17 0332 127/83 98.1 F (36.7 C) Oral 72 - -  02/11/17 0257 112/70 98.3 F (36.8 C) - 76  20 -  02/11/17 0046 124/76 - - - - 100 %  02/11/17 0044 - 97.7 F (36.5 C) - 86 20 -     Physical Exam  Nursing note and vitals reviewed. Constitutional: She is oriented to person, place, and time. She appears well-developed and well-nourished. No distress.  HENT:  Head: Normocephalic and atraumatic.  Eyes: Conjunctivae are normal. Right eye exhibits no discharge. Left eye exhibits no discharge. No scleral icterus.  Neck: Normal range of motion.  Cardiovascular: Normal rate, regular rhythm and normal heart sounds.   No murmur heard. Respiratory: Effort normal and breath sounds normal. No respiratory distress. She has no wheezes.  GI: Soft. There is tenderness in the right  lower quadrant, suprapubic area and left lower quadrant. There is no rigidity and no guarding.  Genitourinary: Uterus is enlarged and tender. Cervix exhibits no friability. Right adnexum displays tenderness. Left adnexum displays tenderness. There is bleeding (small amount of rust colored blood) in the vagina.  Neurological: She is alert and oriented to person, place, and time.  Skin: Skin is warm and dry. She is not diaphoretic.  Psychiatric: She has a normal mood and affect. Her behavior is normal. Judgment and thought content normal.    MAU Course  Procedures Results for orders placed or performed during the hospital encounter of 02/11/17 (from the past 24 hour(s))  Urinalysis, Routine w reflex microscopic     Status: Abnormal   Collection Time: 02/11/17 12:55 AM  Result Value Ref Range   Color, Urine YELLOW YELLOW   APPearance HAZY (A) CLEAR   Specific Gravity, Urine 1.013 1.005 - 1.030   pH 6.0 5.0 - 8.0   Glucose, UA NEGATIVE NEGATIVE mg/dL   Hgb urine dipstick LARGE (A) NEGATIVE   Bilirubin Urine NEGATIVE NEGATIVE   Ketones, ur NEGATIVE NEGATIVE mg/dL   Protein, ur NEGATIVE NEGATIVE mg/dL   Nitrite NEGATIVE NEGATIVE   Leukocytes, UA LARGE (A) NEGATIVE   RBC / HPF 6-30 0 - 5 RBC/hpf   WBC, UA TOO NUMEROUS TO COUNT 0 - 5 WBC/hpf   Bacteria, UA RARE (A) NONE SEEN   Squamous Epithelial / LPF 0-5 (A) NONE SEEN   Mucous PRESENT   CBC with Differential/Platelet     Status: Abnormal   Collection Time: 02/11/17  1:13 AM  Result Value Ref Range   WBC 8.0 4.0 - 10.5 K/uL   RBC 2.58 (L) 3.87 - 5.11 MIL/uL   Hemoglobin 7.4 (L) 12.0 - 15.0 g/dL   HCT 21.322.7 (L) 08.636.0 - 57.846.0 %   MCV 88.0 78.0 - 100.0 fL   MCH 28.7 26.0 - 34.0 pg   MCHC 32.6 30.0 - 36.0 g/dL   RDW 46.915.9 (H) 62.911.5 - 52.815.5 %   Platelets 301 150 - 400 K/uL   Neutrophils Relative % 76 %   Lymphocytes Relative 17 %   Monocytes Relative 3 %   Eosinophils Relative 3 %   Basophils Relative 1 %   Other 0 %   Neutro Abs 6.1 1.7  - 7.7 K/uL   Lymphs Abs 1.4 0.7 - 4.0 K/uL   Monocytes Absolute 0.2 0.1 - 1.0 K/uL   Eosinophils Absolute 0.2 0.0 - 0.7 K/uL   Basophils Absolute 0.1 0.0 - 0.1 K/uL   RBC Morphology POLYCHROMASIA PRESENT    [place] Transvaginal Non-ob  Result Date: 02/11/2017 CLINICAL DATA:  26 year old female with postpartum pain. Delivery date on July 28 18. EXAM: TRANSABDOMINAL AND TRANSVAGINAL ULTRASOUND OF PELVIS TECHNIQUE: Both transabdominal and transvaginal  ultrasound examinations of the pelvis were performed. Transabdominal technique was performed for global imaging of the pelvis including uterus, ovaries, adnexal regions, and pelvic cul-de-sac. It was necessary to proceed with endovaginal exam following the transabdominal exam to visualize the endometrium and the ovaries. COMPARISON:  None FINDINGS: Uterus Measurements: Enlarged measuring 17.2 x 9.2 x 11.6 cm. The uterus is anteverted and heterogeneous. No fibroids or other mass visualized. Endometrium Thickness: 11 mm. The endometrium is slightly heterogeneous. A small vessel is noted in the upper endometrium. No hyperemia. Small scattered echogenic foci with no posterior shadowing along the endometrium may represent retained proteinaceous content. Sign Right ovary Measurements: 3.4 x 2.0 x 3.8 cm. Normal appearance/no adnexal mass. Left ovary Measurements: 2.5 x 1.8 x 3.0 cm. Normal appearance/no adnexal mass. Other findings Small free fluid within the pelvis, likely physiologic. IMPRESSION: 1. Enlarged postpartum uterus. 2. Stop mildly heterogeneous endometrium. There is a vessel in the upper endometrium of indeterminate significance. No hyperemia or definite sonographic evidence of retained products of conception. 3. Unremarkable ovaries. Electronically Signed   By: Elgie CollardArash  Radparvar M.D.   On: 02/11/2017 02:37   [place] Pelvis Complete  Result Date: 02/11/2017 CLINICAL DATA:  26 year old female with postpartum pain. Delivery date on July 28 18. EXAM: TRANSABDOMINAL  AND TRANSVAGINAL ULTRASOUND OF PELVIS TECHNIQUE: Both transabdominal and transvaginal ultrasound examinations of the pelvis were performed. Transabdominal technique was performed for global imaging of the pelvis including uterus, ovaries, adnexal regions, and pelvic cul-de-sac. It was necessary to proceed with endovaginal exam following the transabdominal exam to visualize the endometrium and the ovaries. COMPARISON:  None FINDINGS: Uterus Measurements: Enlarged measuring 17.2 x 9.2 x 11.6 cm. The uterus is anteverted and heterogeneous. No fibroids or other mass visualized. Endometrium Thickness: 11 mm. The endometrium is slightly heterogeneous. A small vessel is noted in the upper endometrium. No hyperemia. Small scattered echogenic foci with no posterior shadowing along the endometrium may represent retained proteinaceous content. Sign Right ovary Measurements: 3.4 x 2.0 x 3.8 cm. Normal appearance/no adnexal mass. Left ovary Measurements: 2.5 x 1.8 x 3.0 cm. Normal appearance/no adnexal mass. Other findings Small free fluid within the pelvis, likely physiologic. IMPRESSION: 1. Enlarged postpartum uterus. 2. Stop mildly heterogeneous endometrium. There is a vessel in the upper endometrium of indeterminate significance. No hyperemia or definite sonographic evidence of retained products of conception. 3. Unremarkable ovaries. Electronically Signed   By: Elgie CollardArash  Radparvar M.D.   On: 02/11/2017 02:37    MDM VSS, pt afebrile CBC w/diff -- no leukocytosis, hemoglobin 7.4 (pt asymptomatic & hemodynamically stable) Ultrasound  Patient declines pain medication in MAU S/w Dr. Tenny Crawoss. Reviewed labs, ultrasound, assessment, & VS. Will offer patient pain management. Patient is stable for discharge.  Discussed results of ultrasound & labs with patient. Agreeable to plan for discharge home. Will take ibuprofen on schedule & percocet for breakthrough pain. Instructed to continue antibiotics until completed.  Assessment and  Plan  A; 1. Postpartum pain    P: Discharge home Rx percocet prn breakthrough pain Continue ibuprofen on schedule Complete antibiotics Discussed reasons to return to MAU Keep f/u with OB or call PRN  Judeth HornErin Brendalee Matthies 02/11/2017, 1:07 AM

## 2017-02-12 LAB — URINE CULTURE

## 2017-03-06 DIAGNOSIS — Z124 Encounter for screening for malignant neoplasm of cervix: Secondary | ICD-10-CM | POA: Diagnosis not present

## 2017-04-21 DIAGNOSIS — J029 Acute pharyngitis, unspecified: Secondary | ICD-10-CM | POA: Diagnosis not present

## 2017-06-27 ENCOUNTER — Encounter: Payer: Self-pay | Admitting: Physician Assistant

## 2017-06-27 ENCOUNTER — Ambulatory Visit (INDEPENDENT_AMBULATORY_CARE_PROVIDER_SITE_OTHER): Payer: Commercial Managed Care - PPO | Admitting: Physician Assistant

## 2017-06-27 VITALS — BP 135/87 | HR 130 | Temp 98.3°F | Ht 65.0 in | Wt 194.2 lb

## 2017-06-27 DIAGNOSIS — R509 Fever, unspecified: Secondary | ICD-10-CM

## 2017-06-27 DIAGNOSIS — J039 Acute tonsillitis, unspecified: Secondary | ICD-10-CM

## 2017-06-27 LAB — RAPID STREP SCREEN (MED CTR MEBANE ONLY): Strep Gp A Ag, IA W/Reflex: NEGATIVE

## 2017-06-27 LAB — VERITOR FLU A/B WAIVED
Influenza A: NEGATIVE
Influenza B: NEGATIVE

## 2017-06-27 LAB — CULTURE, GROUP A STREP

## 2017-06-27 MED ORDER — AMOXICILLIN 500 MG PO CAPS: 500.0000 mg | ORAL_CAPSULE | Freq: Three times a day (TID) | ORAL | 0 refills | Status: DC

## 2017-06-27 NOTE — Patient Instructions (Signed)
In a few days you may receive a survey in the mail or online from [employer] regarding your visit with us today. Please take a moment to fill this out. Your feedback is very important to our whole office. It can help us better understand your needs as well as improve your experience and satisfaction. Thank you for taking your time to complete it. We care about you.  Jeson Camacho, PA-C  

## 2017-06-27 NOTE — Progress Notes (Signed)
BP 135/87   Pulse (!) 130   Temp 98.3 F (36.8 C) (Oral)   Ht 5\' 5"  (1.651 m)   Wt 194 lb 3.2 oz (88.1 kg)   LMP 06/20/2017   BMI 32.32 kg/m    Subjective:    Patient ID: Andrea DienesAmbra J Huynh, female    DOB: 17-Mar-1991, 26 y.o.   MRN: [id number]  HPI: Andrea Huynh is a 26 y.o. female presenting on 06/27/2017 for Sore Throat (x 1 day); Nasal Congestion; Fever (104- yesterday ); and Generalized Body Aches  This patient has had less than 2 days severe fever, chills, myalgias.  Complains of sinus headache and postnasal drainage. There is copious drainage at times. Associated sore throat. Pain with swallowing, decreased appetite and headache.  Exposure to strep. Temp up to 102-104.  Relevant past medical, surgical, family and social history reviewed and updated as indicated. Allergies and medications reviewed and updated.  Past Medical History:  Diagnosis Date  . Abdominal pain, RLQ 2010  . Gastroparesis JUN 2015 SMART PILL   NO SYMPTOMS  . 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 COLONOSCOPY  08/04/2011   Procedure: COLONOSCOPY;  Surgeon: Arlyce HarmanSandi M Fields, MD;  [facility];  Service: Endoscopy;  Laterality: N/A;  12:30  . FLEXIBLE SIGMOIDOSCOPY N/A 12/29/2013   Procedure: FLEXIBLE SIGMOIDOSCOPY;  Surgeon: West BaliSandi L Fields, MD;  [facility];  Service: Endoscopy;  Laterality: N/A;  12:15    Review of Systems  Constitutional: Positive for appetite change, chills, fatigue and fever. Negative for activity change.  HENT: Positive for congestion, postnasal drip and sore throat.   Eyes: Negative.   Respiratory: Negative for cough and wheezing.   Cardiovascular: Negative.  Negative for chest pain, palpitations and leg swelling.  Gastrointestinal: Negative.   Genitourinary: Negative.   Musculoskeletal: Positive for myalgias.  Skin: Negative.   Neurological: Positive  for headaches.    Allergies as of 06/27/2017   No Known Allergies     Medication List        Accurate as of 06/27/17 10:38 AM. Always use your most recent med list.          amoxicillin 500 MG capsule Commonly known as:  AMOXIL Take 1 capsule (500 mg total) by mouth 3 (three) times daily.   ibuprofen 600 MG tablet Commonly known as:  ADVIL,MOTRIN Take 1 tablet (600 mg total) by mouth every 6 (six) hours as needed.          Objective:    BP 135/87   Pulse (!) 130   Temp 98.3 F (36.8 C) (Oral)   Ht 5\' 5"  (1.651 m)   Wt 194 lb 3.2 oz (88.1 kg)   LMP 06/20/2017   BMI 32.32 kg/m   No Known Allergies  Physical Exam  Constitutional: She is oriented to person, place, and time. She appears well-developed and well-nourished.  HENT:  Head: Normocephalic and atraumatic.  Right Ear: Tympanic membrane and ear canal normal. No middle ear effusion.  Left Ear: Tympanic membrane and ear canal normal.  Nose: Mucosal edema present. Right sinus exhibits no frontal sinus tenderness. Left sinus exhibits no frontal sinus tenderness.  Mouth/Throat: Oropharyngeal exudate, posterior oropharyngeal edema and posterior oropharyngeal erythema present. No tonsillar abscesses.  Eyes: Conjunctivae and EOM are normal. Pupils are equal, round, and reactive to light.  Neck: Normal range of motion.  Cardiovascular: Normal rate,  regular rhythm, normal heart sounds and intact distal pulses.  Pulmonary/Chest: Effort normal and breath sounds normal.  Abdominal: Soft. Bowel sounds are normal.  Neurological: She is alert and oriented to person, place, and time. She has normal reflexes.  Skin: Skin is warm and dry. No rash noted.  Psychiatric: She has a normal mood and affect. Her behavior is normal. Judgment and thought content normal.  Nursing note and vitals reviewed.       Assessment & Plan:   1. Fever, unspecified fever cause - Rapid Strep Screen ([facility]) - Veritor Flu A/B Waived -  Culture, Group A Strep  2. Tonsillitis - amoxicillin (AMOXIL) 500 MG capsule; Take 1 capsule (500 mg total) by mouth 3 (three) times daily.  Dispense: 30 capsule; Refill: 0    Current Outpatient Medications:  .  amoxicillin (AMOXIL) 500 MG capsule, Take 1 capsule (500 mg total) by mouth 3 (three) times daily., Disp: 30 capsule, Rfl: 0 .  ibuprofen (ADVIL,MOTRIN) 600 MG tablet, Take 1 tablet (600 mg total) by mouth every 6 (six) hours as needed., Disp: 40 tablet, Rfl: 0 Continue all other maintenance medications as listed above.  Follow up plan: Follow-up as needed or worsening of symptoms. Call office for any issues.   Educational handout given for survey  Remus LofflerAngel S. Rital Cavey PA-C [facility] [address]  [place], [place] [address] [phone]   06/27/2017, 10:38 AM

## 2017-06-29 ENCOUNTER — Telehealth: Payer: Self-pay | Admitting: Physician Assistant

## 2017-06-29 LAB — CULTURE, GROUP A STREP

## 2017-06-29 NOTE — Telephone Encounter (Signed)
Patient aware and note up front for patient

## 2017-06-29 NOTE — Telephone Encounter (Signed)
Do you know anything about this? 

## 2017-06-29 NOTE — Telephone Encounter (Signed)
Ok with note.   Murtis SinkSam Doria Fern, MD [facility] 06/29/2017, 11:39 AM

## 2017-11-05 ENCOUNTER — Encounter: Payer: Self-pay | Admitting: Physician Assistant

## 2017-11-05 ENCOUNTER — Ambulatory Visit (INDEPENDENT_AMBULATORY_CARE_PROVIDER_SITE_OTHER): Payer: Commercial Managed Care - PPO | Admitting: Physician Assistant

## 2017-11-05 VITALS — BP 127/84 | HR 112 | Temp 97.9°F | Ht 65.0 in | Wt 185.4 lb

## 2017-11-05 DIAGNOSIS — J029 Acute pharyngitis, unspecified: Secondary | ICD-10-CM

## 2017-11-05 DIAGNOSIS — J039 Acute tonsillitis, unspecified: Secondary | ICD-10-CM

## 2017-11-05 LAB — RAPID STREP SCREEN (MED CTR MEBANE ONLY): Strep Gp A Ag, IA W/Reflex: POSITIVE — AB

## 2017-11-05 MED ORDER — AMOXICILLIN 500 MG PO CAPS
500.0000 mg | ORAL_CAPSULE | Freq: Three times a day (TID) | ORAL | 0 refills | Status: DC
Start: 2017-11-05 — End: 2018-03-13

## 2017-11-05 NOTE — Progress Notes (Signed)
BP 127/84   Pulse (!) 112   Temp 97.9 F (36.6 C) (Oral)   Ht  (1.651 m)   Wt 185 lb 6.4 oz (84.1 kg)   BMI 30.85 kg/m    Subjective:    Patient ID: Andrea Huynh, female    DOB: May 16, 1991, 27 y.o.   MRN: [id number]  HPI: Andrea Huynh is a 27 y.o. female presenting on 11/05/2017 for Ear Pain (bilateral ); Sore Throat; and Headache  .This patient has sore throat and had less than 2 days severe fever, chills, myalgias.  Complains of sinus headache and postnasal drainage. There is copious drainage at times. Pain with swallowing, decreased appetite and headache.  Exposure to strep.   Past Medical History:  Diagnosis Date  . Abdominal pain, RLQ 2010  . Gastroparesis JUN 2015 SMART PILL   NO SYMPTOMS  . Solitary rectal ulcer SYNDROME JAN 2013/JUN 2015   TCS-Bx: PROLAPSE/ISCHEMIC CHANGES   Relevant past medical, surgical, family and social history reviewed and updated as indicated. Interim medical history since our last visit reviewed. Allergies and medications reviewed and updated. DATA REVIEWED: CHART IN EPIC  Family History reviewed for pertinent findings.  Review of Systems  Constitutional: Positive for fatigue and fever.  HENT: Positive for congestion, ear pain and sore throat.   Eyes: Negative.   Respiratory: Negative.   Gastrointestinal: Negative.   Genitourinary: Negative.     Allergies as of 11/05/2017   No Known Allergies     Medication List        Accurate as of 11/05/17  1:26 PM. Always use your most recent med list.          amoxicillin 500 MG capsule Commonly known as:  AMOXIL Take 1 capsule (500 mg total) by mouth 3 (three) times daily.   VIENVA 0.1-20 MG-MCG tablet Generic drug:  levonorgestrel-ethinyl estradiol          Objective:    BP 127/84   Pulse (!) 112   Temp 97.9 F (36.6 C) (Oral)   Ht  (1.651 m)   Wt 185 lb 6.4 oz (84.1 kg)   BMI 30.85 kg/m   No Known Allergies  Wt Readings from Last 3 Encounters:    11/05/17 185 lb 6.4 oz (84.1 kg)  06/27/17 194 lb 3.2 oz (88.1 kg)  02/02/17 220 lb (99.8 kg)    Physical Exam  Constitutional: She is oriented to person, place, and time. She appears well-developed and well-nourished.  HENT:  Head: Normocephalic and atraumatic.  Right Ear: Tympanic membrane and ear canal normal. No middle ear effusion.  Left Ear: Tympanic membrane and ear canal normal.  Nose: Mucosal edema present. Right sinus exhibits no frontal sinus tenderness. Left sinus exhibits no frontal sinus tenderness.  Mouth/Throat: Oropharyngeal exudate, posterior oropharyngeal edema and posterior oropharyngeal erythema present. No tonsillar abscesses.  Eyes: Pupils are equal, round, and reactive to light. Conjunctivae and EOM are normal.  Neck: Normal range of motion.  Cardiovascular: Normal rate, regular rhythm, normal heart sounds and intact distal pulses.  Pulmonary/Chest: Effort normal and breath sounds normal.  Abdominal: Soft. Bowel sounds are normal.  Neurological: She is alert and oriented to person, place, and time. She has normal reflexes.  Skin: Skin is warm and dry. No rash noted.  Psychiatric: She has a normal mood and affect. Her behavior is normal. Judgment and thought content normal.  Nursing note and vitals reviewed.       Assessment & Plan:  1. Sore throat - Rapid Strep Screen ([facility] ONLY)  2. Tonsillitis - amoxicillin (AMOXIL) 500 MG capsule; Take 1 capsule (500 mg total) by mouth 3 (three) times daily.  Dispense: 30 capsule; Refill: 0   Continue all other maintenance medications as listed above.  Follow up plan: No follow-ups on file.  Educational handout given for survey  Remus Loffler PA-C [facility] [address]  [place], [place] [address] [phone]   11/05/2017, 1:26 PM

## 2018-01-04 DIAGNOSIS — N925 Other specified irregular menstruation: Secondary | ICD-10-CM | POA: Diagnosis not present

## 2018-01-04 DIAGNOSIS — Z3201 Encounter for pregnancy test, result positive: Secondary | ICD-10-CM | POA: Diagnosis not present

## 2018-01-07 DIAGNOSIS — N925 Other specified irregular menstruation: Secondary | ICD-10-CM | POA: Diagnosis not present

## 2018-01-14 DIAGNOSIS — L602 Onychogryphosis: Secondary | ICD-10-CM | POA: Diagnosis not present

## 2018-01-17 DIAGNOSIS — O3680X9 Pregnancy with inconclusive fetal viability, other fetus: Secondary | ICD-10-CM | POA: Diagnosis not present

## 2018-02-27 DIAGNOSIS — Z369 Encounter for antenatal screening, unspecified: Secondary | ICD-10-CM | POA: Diagnosis not present

## 2018-02-27 DIAGNOSIS — Z3480 Encounter for supervision of other normal pregnancy, unspecified trimester: Secondary | ICD-10-CM | POA: Diagnosis not present

## 2018-03-13 ENCOUNTER — Encounter: Payer: Self-pay | Admitting: Family Medicine

## 2018-03-13 ENCOUNTER — Ambulatory Visit: Payer: Commercial Managed Care - PPO | Admitting: Family Medicine

## 2018-03-13 VITALS — BP 118/77 | HR 113 | Temp 98.8°F | Ht 65.0 in | Wt 187.0 lb

## 2018-03-13 DIAGNOSIS — J039 Acute tonsillitis, unspecified: Secondary | ICD-10-CM | POA: Diagnosis not present

## 2018-03-13 DIAGNOSIS — J029 Acute pharyngitis, unspecified: Secondary | ICD-10-CM | POA: Diagnosis not present

## 2018-03-13 LAB — RAPID STREP SCREEN (MED CTR MEBANE ONLY): STREP GP A AG, IA W/REFLEX: NEGATIVE

## 2018-03-13 LAB — CULTURE, GROUP A STREP

## 2018-03-13 MED ORDER — CEFUROXIME AXETIL 500 MG PO TABS: 500.0000 mg | ORAL_TABLET | Freq: Two times a day (BID) | ORAL | 0 refills | Status: DC

## 2018-03-13 NOTE — Progress Notes (Signed)
Chief Complaint  Patient presents with  . Sore Throat    Patient is [redacted] weeks pregnant    HPI  Patient presents today for 2-3 days of increasing sore throat. 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 No fever. No cough.   PMH: Smoking status noted ROS: Per HPI   Objective: BP 118/77   Pulse (!) 113   Temp 98.8 F (37.1 C) (Oral)   Ht 5\' 5"  (1.651 m)   Wt 187 lb (84.8 kg)   LMP 06/20/2017   BMI 31.12 kg/m  Gen: NAD, alert, cooperative with exam HEENT: NCAT, EOMI, PERRL. Tonsills large, red cryptic. Right is 2-3+. Left is 4+ CV: RRR, good S1/S2, no murmur Resp: CTABL, no wheezes, non-labored Neuro: Alert and oriented, No gross deficits  Assessment and plan:  1. Tonsillitis     Meds ordered this encounter  Medications  . cefUROXime (CEFTIN) 500 MG tablet    Sig: Take 1 tablet (500 mg total) by mouth 2 (two) times daily with a meal.    Dispense:  20 tablet    Refill:  0    Orders Placed This Encounter  Procedures  . Rapid Strep Screen ([facility] ONLY)    Follow up as needed.  Mechele Claude, MD

## 2018-03-19 DIAGNOSIS — L03032 Cellulitis of left toe: Secondary | ICD-10-CM | POA: Diagnosis not present

## 2018-03-19 DIAGNOSIS — M79675 Pain in left toe(s): Secondary | ICD-10-CM | POA: Diagnosis not present

## 2018-03-20 ENCOUNTER — Ambulatory Visit: Payer: Commercial Managed Care - PPO | Admitting: Family Medicine

## 2018-03-20 ENCOUNTER — Encounter: Payer: Self-pay | Admitting: Family Medicine

## 2018-03-20 VITALS — BP 119/71 | HR 86 | Temp 98.4°F | Ht 65.0 in | Wt 191.4 lb

## 2018-03-20 DIAGNOSIS — Z021 Encounter for pre-employment examination: Secondary | ICD-10-CM

## 2018-03-20 DIAGNOSIS — Z23 Encounter for immunization: Secondary | ICD-10-CM | POA: Diagnosis not present

## 2018-03-20 DIAGNOSIS — Z3A18 18 weeks gestation of pregnancy: Secondary | ICD-10-CM

## 2018-03-20 NOTE — Progress Notes (Signed)
Chief Complaint  Patient presents with  . 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  She is working as a Conservation officer, nature.  She has resolved the symptoms that were treated her last week visit.  She is [redacted] weeks pregnant.  She brings in records showing that she is due for her second hepatitis B vaccine. PMH: Smoking status noted ROS: Per HPI  Objective: BP 119/71   Pulse 86   Temp 98.4 F (36.9 C) (Oral)   Ht 5\' 5"  (1.651 m)   Wt 191 lb 6 oz (86.8 kg)   LMP 06/20/2017   BMI 31.85 kg/m  Gen: NAD, alert, cooperative with exam HEENT: NCAT, EOMI, PERRL CV: RRR, good S1/S2, no murmur Resp: CTABL, no wheezes, non-labored Abd: SNTND, BS present, no guarding or organomegaly Ext: No edema, warm Neuro: Alert and oriented, No gross deficits  Assessment and plan:  1. [redacted] weeks gestation of pregnancy   2. Need for hepatitis B vaccination     No orders of the defined types were placed in this encounter.   Orders Placed This Encounter  Procedures  . Hepatitis B vaccine adult IM    Follow up as needed.  Mechele Claude, MD

## 2018-03-29 DIAGNOSIS — Z3482 Encounter for supervision of other normal pregnancy, second trimester: Secondary | ICD-10-CM | POA: Diagnosis not present

## 2018-04-24 DIAGNOSIS — O99212 Obesity complicating pregnancy, second trimester: Secondary | ICD-10-CM | POA: Diagnosis not present

## 2018-05-05 DIAGNOSIS — L258 Unspecified contact dermatitis due to other agents: Secondary | ICD-10-CM | POA: Diagnosis not present

## 2018-05-15 DIAGNOSIS — Z369 Encounter for antenatal screening, unspecified: Secondary | ICD-10-CM | POA: Diagnosis not present

## 2018-05-26 ENCOUNTER — Encounter (HOSPITAL_COMMUNITY): Payer: Self-pay

## 2018-05-26 ENCOUNTER — Inpatient Hospital Stay (HOSPITAL_COMMUNITY)
Admission: AD | Admit: 2018-05-26 | Discharge: 2018-05-26 | Disposition: A | Discharge status: Home / Self Care | Payer: 59 | Source: Ambulatory Visit | Attending: Obstetrics and Gynecology | Admitting: Obstetrics and Gynecology

## 2018-05-26 ENCOUNTER — Other Ambulatory Visit: Payer: Self-pay

## 2018-05-26 DIAGNOSIS — D72829 Elevated white blood cell count, unspecified: Secondary | ICD-10-CM | POA: Diagnosis not present

## 2018-05-26 DIAGNOSIS — Z3492 Encounter for supervision of normal pregnancy, unspecified, second trimester: Secondary | ICD-10-CM

## 2018-05-26 DIAGNOSIS — O2342 Unspecified infection of urinary tract in pregnancy, second trimester: Secondary | ICD-10-CM | POA: Diagnosis not present

## 2018-05-26 DIAGNOSIS — R109 Unspecified abdominal pain: Secondary | ICD-10-CM | POA: Diagnosis not present

## 2018-05-26 DIAGNOSIS — Z3A24 24 weeks gestation of pregnancy: Secondary | ICD-10-CM

## 2018-05-26 DIAGNOSIS — O26892 Other specified pregnancy related conditions, second trimester: Secondary | ICD-10-CM | POA: Diagnosis not present

## 2018-05-26 LAB — URINALYSIS, ROUTINE W REFLEX MICROSCOPIC
Bilirubin Urine: NEGATIVE
GLUCOSE, UA: NEGATIVE mg/dL
Hgb urine dipstick: NEGATIVE
Ketones, ur: 20 mg/dL — AB
Nitrite: NEGATIVE
PROTEIN: NEGATIVE mg/dL
Specific Gravity, Urine: 1.018 (ref 1.005–1.030)
pH: 6 (ref 5.0–8.0)

## 2018-05-26 LAB — CBC WITH DIFFERENTIAL/PLATELET
BASOS PCT: 0 %
Basophils Absolute: 0 10*3/uL (ref 0.0–0.1)
EOS ABS: 0 10*3/uL (ref 0.0–0.5)
Eosinophils Relative: 0 %
HEMATOCRIT: 39 % (ref 36.0–46.0)
Hemoglobin: 13.1 g/dL (ref 12.0–15.0)
Lymphocytes Relative: 5 %
Lymphs Abs: 0.8 10*3/uL (ref 0.7–4.0)
MCH: 30.4 pg (ref 26.0–34.0)
MCHC: 33.6 g/dL (ref 30.0–36.0)
MCV: 90.5 fL (ref 80.0–100.0)
MONOS PCT: 2 %
Monocytes Absolute: 0.3 10*3/uL (ref 0.1–1.0)
NEUTROS ABS: 14.9 10*3/uL — AB (ref 1.7–7.7)
NRBC: 0 % (ref 0.0–0.2)
Neutrophils Relative %: 93 %
Platelets: 254 10*3/uL (ref 150–400)
RBC: 4.31 MIL/uL (ref 3.87–5.11)
RDW: 14.3 % (ref 11.5–15.5)
WBC: 16.1 10*3/uL — ABNORMAL HIGH (ref 4.0–10.5)

## 2018-05-26 LAB — COMPREHENSIVE METABOLIC PANEL
ALBUMIN: 3.5 g/dL (ref 3.5–5.0)
ALT: 12 U/L (ref 0–44)
AST: 19 U/L (ref 15–41)
Alkaline Phosphatase: 73 U/L (ref 38–126)
Anion gap: 12 (ref 5–15)
BILIRUBIN TOTAL: 0.5 mg/dL (ref 0.3–1.2)
BUN: 7 mg/dL (ref 6–20)
CHLORIDE: 104 mmol/L (ref 98–111)
CO2: 22 mmol/L (ref 22–32)
Calcium: 9.1 mg/dL (ref 8.9–10.3)
Creatinine, Ser: 0.48 mg/dL (ref 0.44–1.00)
GFR calc Af Amer: >60 mL/min (ref >60)
GFR calc non Af Amer: >60 mL/min (ref >60)
GLUCOSE: 96 mg/dL (ref 70–99)
POTASSIUM: 3.6 mmol/L (ref 3.5–5.1)
SODIUM: 138 mmol/L (ref 135–145)
TOTAL PROTEIN: 7.7 g/dL (ref 6.5–8.1)

## 2018-05-26 LAB — LIPASE, BLOOD: Lipase: 26 U/L (ref 11–51)

## 2018-05-26 LAB — AMYLASE: Amylase: 63 U/L (ref 28–100)

## 2018-05-26 MED ORDER — ONDANSETRON 4 MG PO TBDP
4.0000 mg | ORAL_TABLET | Freq: Once | ORAL | Status: AC
  Administered 2018-05-26: 4 mg via ORAL
  Filled 2018-05-26: qty 1

## 2018-05-26 MED ORDER — CEPHALEXIN 500 MG PO CAPS: 500.0000 mg | ORAL_CAPSULE | Freq: Four times a day (QID) | ORAL | 0 refills | Status: AC

## 2018-05-26 MED ORDER — CYCLOBENZAPRINE HCL 10 MG PO TABS
10.0000 mg | ORAL_TABLET | Freq: Once | ORAL | Status: AC
  Administered 2018-05-26: 10 mg via ORAL
  Filled 2018-05-26: qty 1

## 2018-05-26 MED ORDER — ALUM & MAG HYDROXIDE-SIMETH 200-200-20 MG/5ML PO SUSP
30.0000 mL | Freq: Once | ORAL | Status: AC
  Administered 2018-05-26: 30 mL via ORAL
  Filled 2018-05-26: qty 30

## 2018-05-26 NOTE — Discharge Instructions (Signed)

## 2018-05-26 NOTE — MAU Note (Signed)
Pt states she started having some mid/upper abd pain that she rates 10/10.  Pt denies vag. Bleeding or LOF.  Pt also reports some "nausea due to pain" and has vomited twice.

## 2018-05-26 NOTE — MAU Provider Note (Signed)
History     CSN: [id number]  Arrival date and time: 05/26/18 1454   First Provider Initiated Contact with Patient 05/26/18 1532      Chief Complaint  Patient presents with  . Abdominal Pain  . Nausea   HPI  Andrea Huynh is a 27 y.o. G2P1001 at [redacted]w[redacted]d who presents to MAU with chief complaint of pain across the top of her abdomen. This is a new problem, onset this morning. Patient rates pain as 10/10, does not radiate, denies aggravating or alleviating factors. Patient took three Tums before presenting to MAU but did not experience relief. Denies other medications or treatments. Denies vaginal bleeding, leaking of fluid, decreased fetal movement, fever, falls, or recent illness.    Nausea and vomiting This is a new problem, onset coincides with abdominal pain this morning. Patient states her pain is so severe she has vomited twice. Denies history of nausea and vomiting this pregnancy.  OB History    Gravida  2   Para  1   Term  1   Preterm      AB      Living  1     SAB      TAB      Ectopic      Multiple  0   Live Births  1          Past Medical History:  Diagnosis Date  . Abdominal pain, RLQ 2010  . Gastroparesis JUN 2015 SMART PILL   NO SYMPTOMS  . 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 COLONOSCOPY  08/04/2011   Procedure: COLONOSCOPY;  Surgeon: Arlyce Harman, MD;  [facility];  Service: Endoscopy;  Laterality: N/A;  12:30  . FLEXIBLE SIGMOIDOSCOPY N/A 12/29/2013   Procedure: FLEXIBLE SIGMOIDOSCOPY;  Surgeon: West Bali, MD;  Location: [facility];  Service: Endoscopy;  Laterality: N/A;  12:15    Family History  Problem Relation Age of Onset  . Constipation Unknown   . Colon cancer Neg Hx   . Colon polyps Neg Hx   . Stomach cancer Neg Hx     Social History   Tobacco Use  . Smoking status: Never Smoker  . Smokeless  tobacco: Never Used  Substance Use Topics  . Alcohol use: No  . Drug use: No    Allergies: No Known Allergies  Medications Prior to Admission  Medication Sig Dispense Refill Last Dose  . Prenatal MV-Min-Fe Fum-FA-DHA (PRENATAL 1 PO) Take by mouth.   Taking    Review of Systems  Constitutional: Negative for chills, fatigue and fever.  Respiratory: Negative for shortness of breath.   Gastrointestinal: Positive for abdominal pain, nausea and vomiting.  Genitourinary: Negative for difficulty urinating, dyspareunia, vaginal bleeding, vaginal discharge and vaginal pain.  Musculoskeletal: Negative for back pain.  Neurological: Negative for headaches.  All other systems reviewed and are negative.  Physical Exam   Blood pressure 130/72, temperature 98.1 F (36.7 C), temperature source Oral, resp. rate 20, height 5\' 5"  (1.651 m), weight 86.7 kg, last menstrual period 06/20/2017, SpO2 98 %, currently breastfeeding.  Physical Exam  Nursing note and vitals reviewed. Constitutional: She is oriented to person, place, and time. She appears well-developed and well-nourished.  Cardiovascular: Normal rate.  GI: Soft. Bowel sounds are normal. She exhibits no distension. There is no tenderness. There is no rebound, no guarding and no CVA tenderness.  Genitourinary:  Genitourinary Comments: Not evaluated based on chief complaint, HPI  Neurological: She is alert and oriented to person, place, and time. She has normal reflexes.  Skin: Skin is warm and dry.  Psychiatric: She has a normal mood and affect. Her behavior is normal. Judgment and thought content normal.    MAU Course/MDM   --Abdomen remains soft throughout two-minute "tightening" episode --Patient denies pain after Flexeril and GI cocktail --Reactive fetal tracing: baseline 155, moderate variability, positive accels, no decels --Toco: quiet  Patient Vitals for the past 24 hrs:  BP Temp Temp src Pulse Resp SpO2 Height Weight   05/26/18 1743 111/64 - - (!) 103 18 - - -  05/26/18 1509 130/72 98.1 F (36.7 C) Oral - 20 98 % 5\' 5"  (1.651 m) 86.7 kg    Results for orders placed or performed during the hospital encounter of 05/26/18 (from the past 24 hour(s))  Urinalysis, Routine w reflex microscopic     Status: Abnormal   Collection Time: 05/26/18  3:27 PM  Result Value Ref Range   Color, Urine YELLOW YELLOW   APPearance HAZY (A) CLEAR   Specific Gravity, Urine 1.018 1.005 - 1.030   pH 6.0 5.0 - 8.0   Glucose, UA NEGATIVE NEGATIVE mg/dL   Hgb urine dipstick NEGATIVE NEGATIVE   Bilirubin Urine NEGATIVE NEGATIVE   Ketones, ur 20 (A) NEGATIVE mg/dL   Protein, ur NEGATIVE NEGATIVE mg/dL   Nitrite NEGATIVE NEGATIVE   Leukocytes, UA TRACE (A) NEGATIVE   RBC / HPF 11-20 0 - 5 RBC/hpf   WBC, UA 6-10 0 - 5 WBC/hpf   Bacteria, UA RARE (A) NONE SEEN   Squamous Epithelial / LPF 0-5 0 - 5   Mucus PRESENT   CBC with Differential/Platelet     Status: Abnormal   Collection Time: 05/26/18  4:40 PM  Result Value Ref Range   WBC 16.1 (H) 4.0 - 10.5 K/uL   RBC 4.31 3.87 - 5.11 MIL/uL   Hemoglobin 13.1 12.0 - 15.0 g/dL   HCT 57.839.0 46.936.0 - 62.946.0 %   MCV 90.5 80.0 - 100.0 fL   MCH 30.4 26.0 - 34.0 pg   MCHC 33.6 30.0 - 36.0 g/dL   RDW 52.814.3 41.311.5 - 24.415.5 %   Platelets 254 150 - 400 K/uL   nRBC 0.0 0.0 - 0.2 %   Neutrophils Relative % 93 %   Neutro Abs 14.9 (H) 1.7 - 7.7 K/uL   Lymphocytes Relative 5 %   Lymphs Abs 0.8 0.7 - 4.0 K/uL   Monocytes Relative 2 %   Monocytes Absolute 0.3 0.1 - 1.0 K/uL   Eosinophils Relative 0 %   Eosinophils Absolute 0.0 0.0 - 0.5 K/uL   Basophils Relative 0 %   Basophils Absolute 0.0 0.0 - 0.1 K/uL  Comprehensive metabolic panel     Status: None   Collection Time: 05/26/18  4:40 PM  Result Value Ref Range   Sodium 138 135 - 145 mmol/L   Potassium 3.6 3.5 - 5.1 mmol/L   Chloride 104 98 - 111 mmol/L   CO2 22 22 - 32 mmol/L   Glucose, Bld 96 70 - 99 mg/dL   BUN 7 6 - 20 mg/dL    Creatinine, Ser 0.100.48 0.44 - 1.00 mg/dL   Calcium 9.1 8.9 - 27.210.3 mg/dL   Total Protein 7.7 6.5 - 8.1 g/dL   Albumin 3.5 3.5 - 5.0 g/dL   AST 19 15 - 41 U/L   ALT 12 0 -  44 U/L   Alkaline Phosphatase 73 38 - 126 U/L   Total Bilirubin 0.5 0.3 - 1.2 mg/dL   GFR calc non Af Amer >60 >60 mL/min   GFR calc Af Amer >60 >60 mL/min   Anion gap 12 5 - 15  Amylase     Status: None   Collection Time: 05/26/18  4:40 PM  Result Value Ref Range   Amylase 63 28 - 100 U/L  Lipase, blood     Status: None   Collection Time: 05/26/18  4:40 PM  Result Value Ref Range   Lipase 26 11 - 51 U/L   Meds ordered this encounter  Medications  . cyclobenzaprine (FLEXERIL) tablet 10 mg  . alum & mag hydroxide-simeth (MAALOX/MYLANTA) 200-200-20 MG/5ML suspension 30 mL  . ondansetron (ZOFRAN-ODT) disintegrating tablet 4 mg  . cephALEXin (KEFLEX) 500 MG capsule    Sig: Take 1 capsule (500 mg total) by mouth 4 (four) times daily for 10 days.    Dispense:  40 capsule    Refill:  0    Order Specific Question:   Supervising Provider    Answer:   Reva Bores [[id number]]    Assessment and Plan  --27 y.o. G2P1001 at [redacted]w[redacted]d  --Reactive fetal tracing --UTI in pregnancy, rx as described, urine culture pending --Complaint resolved with medications administered in MAU --Reviewed infection precautions, criteria for return to MAU including but not limited to fever, abdominal tenderness, recurrence of pain --Discharge home in stable condition  F/U: Patient has next ob appt first week of December  Chief complaint, HPI, and lab results reviewed with Dr. Shawnie Pons who agrees with my plan of care  Calvert Cantor, CNM 05/26/2018, 5:50 PM

## 2018-06-04 ENCOUNTER — Ambulatory Visit (INDEPENDENT_AMBULATORY_CARE_PROVIDER_SITE_OTHER): Payer: 59

## 2018-06-04 DIAGNOSIS — Z23 Encounter for immunization: Secondary | ICD-10-CM

## 2018-06-05 ENCOUNTER — Ambulatory Visit: Payer: 59

## 2018-06-11 DIAGNOSIS — Z3480 Encounter for supervision of other normal pregnancy, unspecified trimester: Secondary | ICD-10-CM | POA: Diagnosis not present

## 2018-06-24 DIAGNOSIS — O9989 Other specified diseases and conditions complicating pregnancy, childbirth and the puerperium: Secondary | ICD-10-CM | POA: Diagnosis not present

## 2018-06-28 DIAGNOSIS — Z23 Encounter for immunization: Secondary | ICD-10-CM | POA: Diagnosis not present

## 2018-07-10 NOTE — L&D Delivery Note (Signed)
Pt was admitted for a postdate induction. She received 2 cytotecs. She had AROM in the am and pit aug started/. She progressed along a nl labor curve. She pushed for an hour. She had a SVD of one live viable infant over a 2nd degree midline tear in the ROP position. Placenta-S/I. Tear closed with 3-0 chromic. EBL-400cc. Baby to [facility]

## 2018-07-14 IMAGING — US US PELVIS COMPLETE
1 series · 15 of 25 positions shown · non-contrast
Comparison: None

CLINICAL DATA: 26-year-old female with postpartum pain. Delivery
date on February 03, 17.

EXAM:
TRANSABDOMINAL AND TRANSVAGINAL ULTRASOUND OF PELVIS
TECHNIQUE: Both transabdominal and transvaginal ultrasound examinations of the
pelvis were performed. Transabdominal technique was performed for
global imaging of the pelvis including uterus, ovaries, adnexal
regions, and pelvic cul-de-sac. It was necessary to proceed with
endovaginal exam following the transabdominal exam to visualize the
endometrium and the ovaries.

[Series 1: us pelvis complete · 56 acquisitions, 15 frames shown]
[im 1/56]
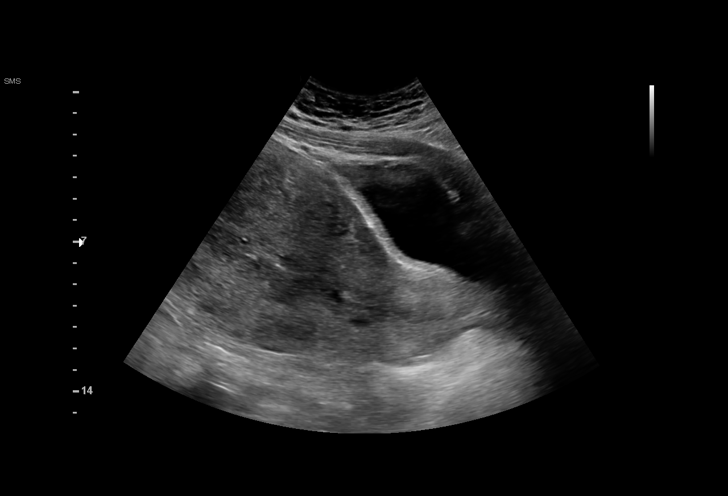
[im 5/56]
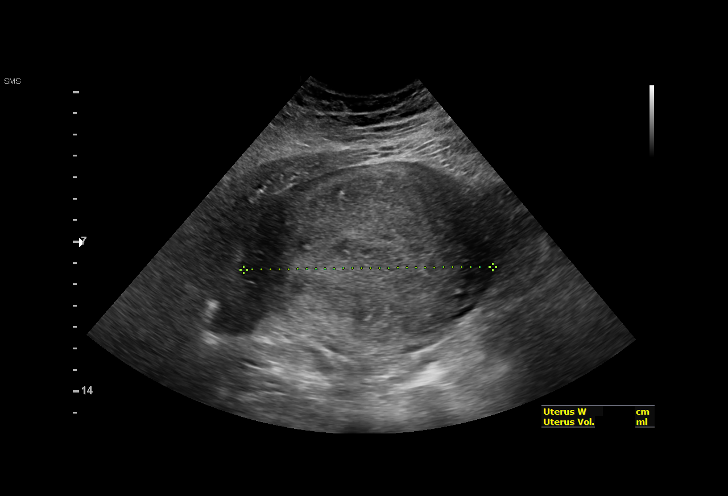
[im 10/56]
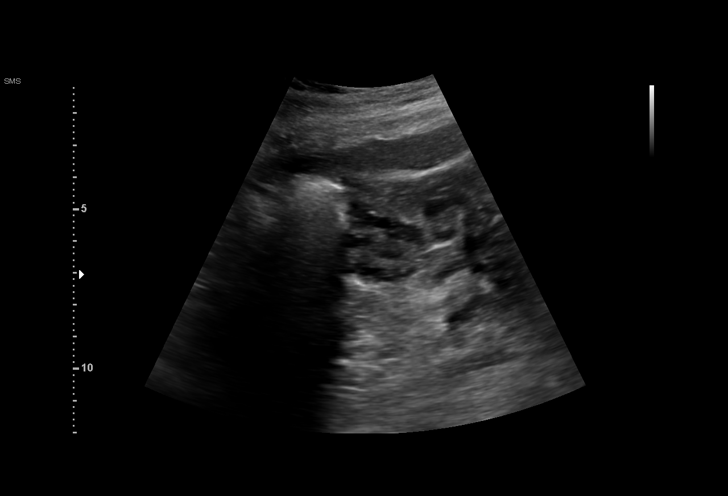
[im 12/56]
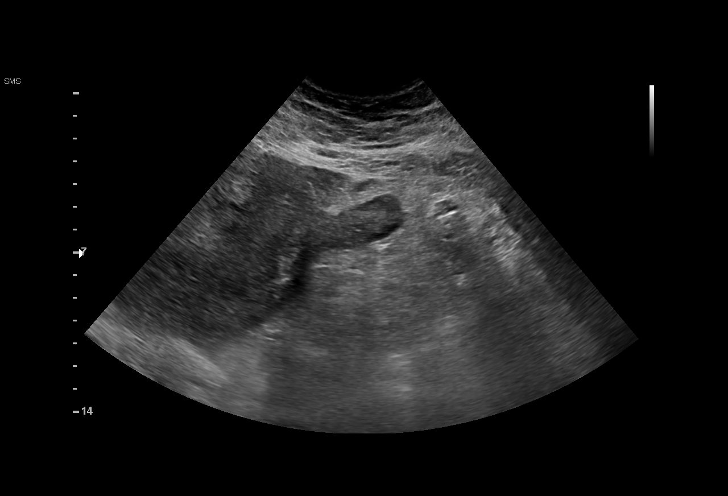
[im 17/56]
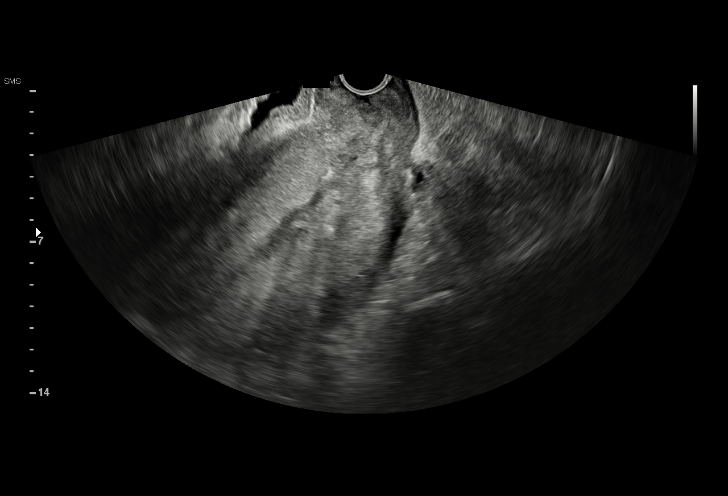
[im 21/56]
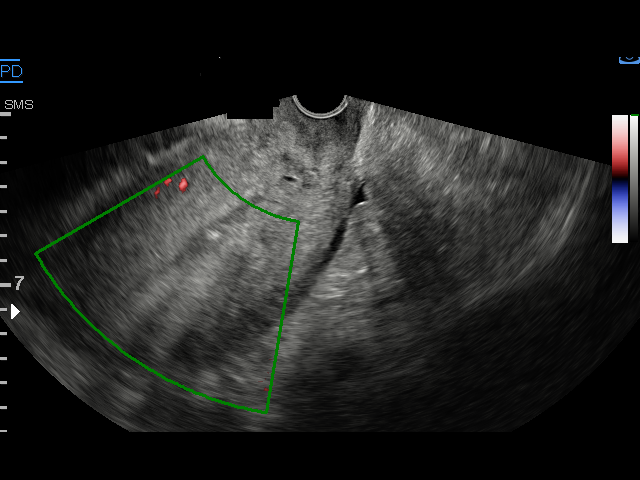
[im 23/56]
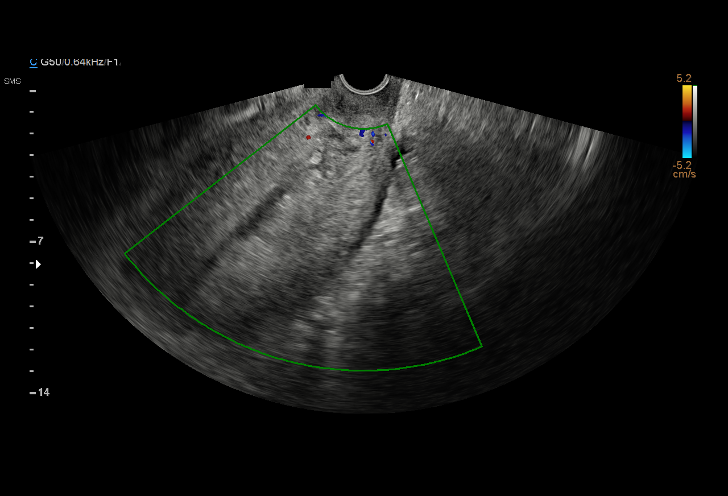
[im 28/56]
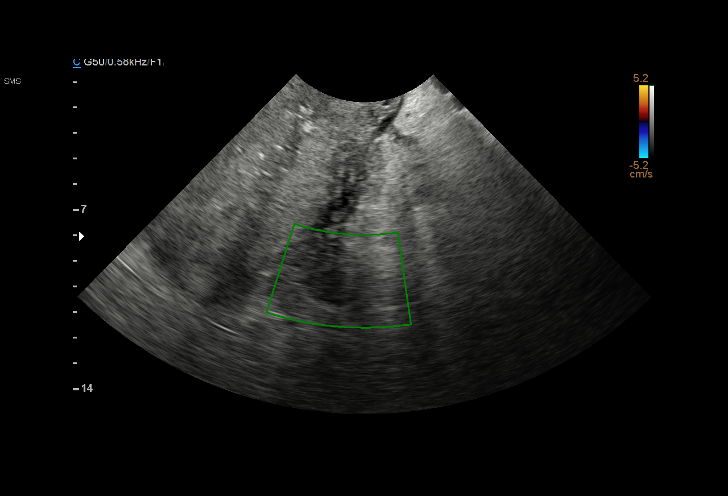
[im 33/56]
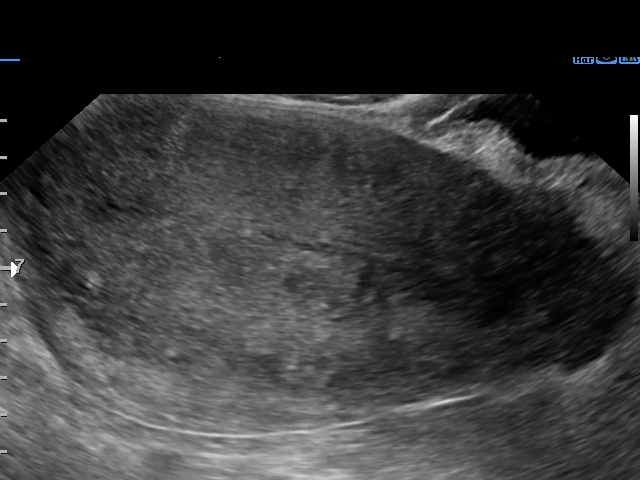
[im 35/56]
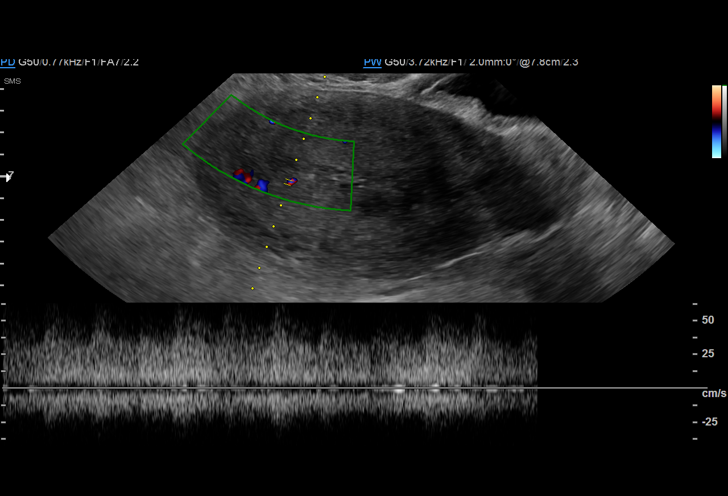
[im 39/56]
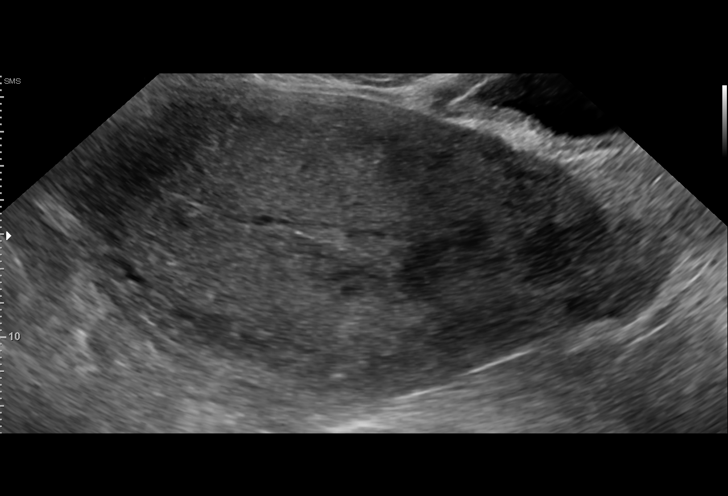
[im 44/56]
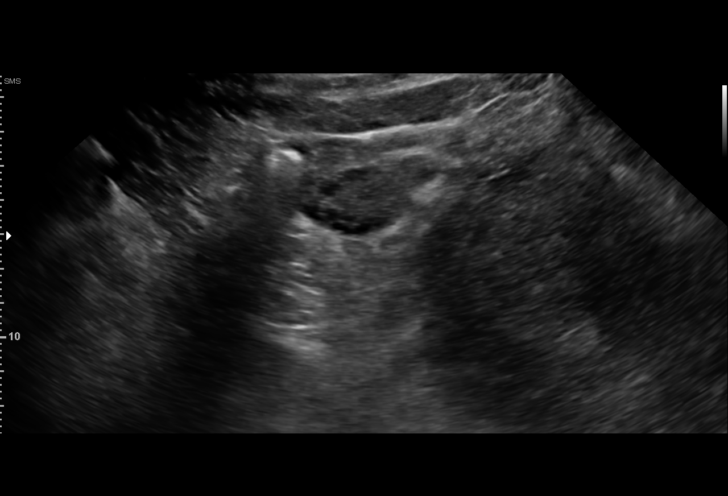
[im 46/56]
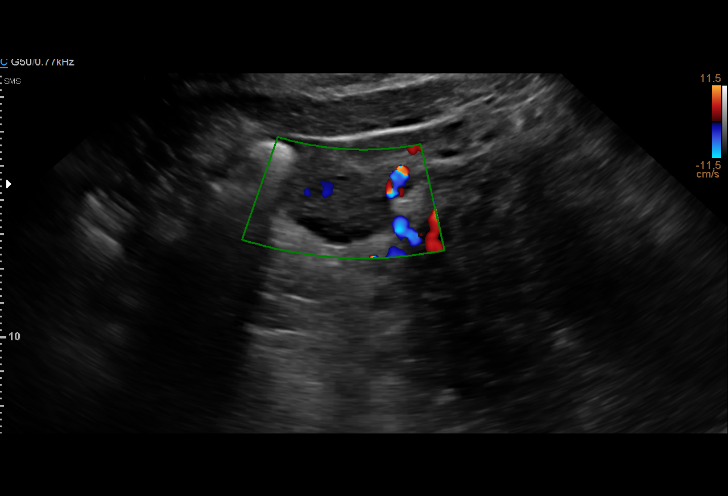
[im 51/56]
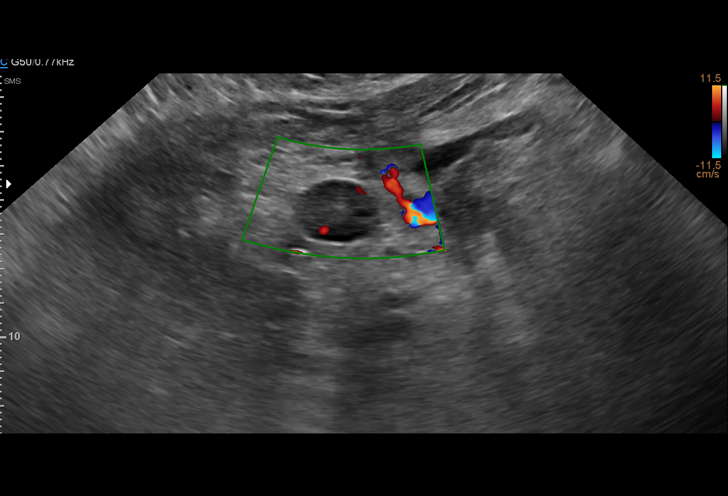
[im 56/56]
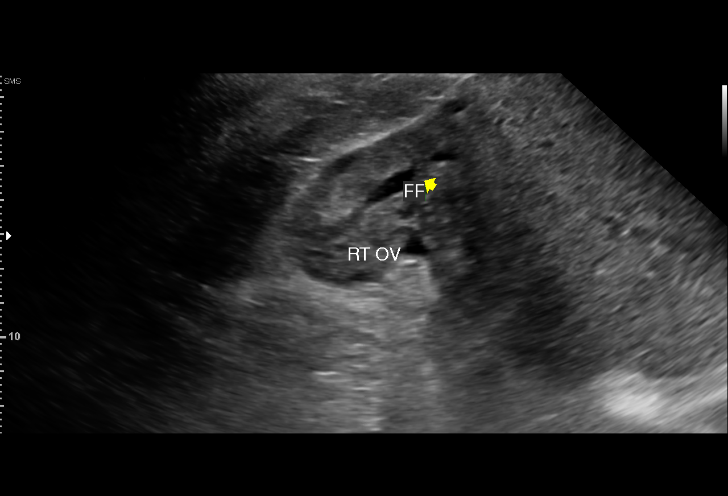

[15 of 25 positions shown; findings below may reference images not displayed]

FINDINGS: Uterus

Measurements: Enlarged measuring 17.2 x 9.2 x 11.6 cm. The uterus is
anteverted and heterogeneous. No fibroids or other mass visualized.

Endometrium

Thickness: 11 mm. The endometrium is slightly heterogeneous. A small
vessel is noted in the upper endometrium. No hyperemia. Small
scattered echogenic foci with no posterior shadowing along the
endometrium may represent retained proteinaceous content. Sign

Right ovary

Measurements: 3.4 x 2.0 x 3.8 cm. Normal appearance/no adnexal mass.

Left ovary

Measurements: 2.5 x 1.8 x 3.0 cm. Normal appearance/no adnexal mass.

Other findings

Small free fluid within the pelvis, likely physiologic.
IMPRESSION: 1. Enlarged postpartum uterus.
2. Stop mildly heterogeneous endometrium. There is a vessel in the
upper endometrium of indeterminate significance. No hyperemia or
definite sonographic evidence of retained products of conception.
3. Unremarkable ovaries.

## 2018-07-29 DIAGNOSIS — Z3482 Encounter for supervision of other normal pregnancy, second trimester: Secondary | ICD-10-CM | POA: Diagnosis not present

## 2018-07-29 DIAGNOSIS — Z3483 Encounter for supervision of other normal pregnancy, third trimester: Secondary | ICD-10-CM | POA: Diagnosis not present

## 2018-08-16 ENCOUNTER — Inpatient Hospital Stay (HOSPITAL_COMMUNITY)
Admission: AD | Admit: 2018-08-16 | Discharge: 2018-08-18 | DRG: 833 | Disposition: A | Discharge status: Home / Self Care | Payer: 59 | Attending: Obstetrics and Gynecology | Admitting: Obstetrics and Gynecology

## 2018-08-16 ENCOUNTER — Encounter (HOSPITAL_COMMUNITY): Payer: Self-pay

## 2018-08-16 ENCOUNTER — Other Ambulatory Visit: Payer: Self-pay

## 2018-08-16 ENCOUNTER — Inpatient Hospital Stay (HOSPITAL_COMMUNITY): Payer: 59

## 2018-08-16 DIAGNOSIS — O2303 Infections of kidney in pregnancy, third trimester: Principal | ICD-10-CM | POA: Diagnosis present

## 2018-08-16 DIAGNOSIS — N10 Acute tubulo-interstitial nephritis: Secondary | ICD-10-CM | POA: Diagnosis not present

## 2018-08-16 DIAGNOSIS — Z3A36 36 weeks gestation of pregnancy: Secondary | ICD-10-CM

## 2018-08-16 DIAGNOSIS — R109 Unspecified abdominal pain: Secondary | ICD-10-CM

## 2018-08-16 DIAGNOSIS — N1330 Unspecified hydronephrosis: Secondary | ICD-10-CM | POA: Diagnosis not present

## 2018-08-16 LAB — COMPREHENSIVE METABOLIC PANEL
ALT: 10 U/L (ref 0–44)
AST: 18 U/L (ref 15–41)
Albumin: 2.7 g/dL — ABNORMAL LOW (ref 3.5–5.0)
Alkaline Phosphatase: 69 U/L (ref 38–126)
Anion gap: 10 (ref 5–15)
BUN: 9 mg/dL (ref 6–20)
CO2: 18 mmol/L — ABNORMAL LOW (ref 22–32)
Calcium: 8 mg/dL — ABNORMAL LOW (ref 8.9–10.3)
Chloride: 105 mmol/L (ref 98–111)
Creatinine, Ser: 0.59 mg/dL (ref 0.44–1.00)
GFR calc Af Amer: >60 mL/min (ref >60)
Glucose, Bld: 115 mg/dL — ABNORMAL HIGH (ref 70–99)
Potassium: 3.6 mmol/L (ref 3.5–5.1)
Sodium: 133 mmol/L — ABNORMAL LOW (ref 135–145)
Total Bilirubin: 0.4 mg/dL (ref 0.3–1.2)
Total Protein: 6.7 g/dL (ref 6.5–8.1)

## 2018-08-16 LAB — URINALYSIS, ROUTINE W REFLEX MICROSCOPIC
GLUCOSE, UA: NEGATIVE mg/dL
Ketones, ur: NEGATIVE mg/dL
Nitrite: NEGATIVE
Protein, ur: 30 mg/dL — AB
SPECIFIC GRAVITY, URINE: 1.02 (ref 1.005–1.030)
pH: 6.5 (ref 5.0–8.0)

## 2018-08-16 LAB — CBC WITH DIFFERENTIAL/PLATELET
Basophils Absolute: 0 10*3/uL (ref 0.0–0.1)
Basophils Relative: 0 %
EOS PCT: 0 %
Eosinophils Absolute: 0 10*3/uL (ref 0.0–0.5)
HCT: 31.7 % — ABNORMAL LOW (ref 36.0–46.0)
Hemoglobin: 10.3 g/dL — ABNORMAL LOW (ref 12.0–15.0)
LYMPHS PCT: 11 %
Lymphs Abs: 1.4 10*3/uL (ref 0.7–4.0)
MCH: 27.8 pg (ref 26.0–34.0)
MCHC: 32.5 g/dL (ref 30.0–36.0)
MCV: 85.4 fL (ref 80.0–100.0)
MONO ABS: 0.3 10*3/uL (ref 0.1–1.0)
MONOS PCT: 2 %
Neutro Abs: 11.3 10*3/uL — ABNORMAL HIGH (ref 1.7–7.7)
Neutrophils Relative %: 87 %
PLATELETS: 253 10*3/uL (ref 150–400)
RBC: 3.71 MIL/uL — ABNORMAL LOW (ref 3.87–5.11)
RDW: 15.2 % (ref 11.5–15.5)
WBC: 13.1 10*3/uL — AB (ref 4.0–10.5)
nRBC: 0 % (ref 0.0–0.2)

## 2018-08-16 LAB — TYPE AND SCREEN
ABO/RH(D): A POS
Antibody Screen: NEGATIVE

## 2018-08-16 LAB — URINALYSIS, MICROSCOPIC (REFLEX)

## 2018-08-16 MED ORDER — NALOXONE HCL 0.4 MG/ML IJ SOLN: 0.4000 mg | INTRAMUSCULAR | Status: DC | PRN

## 2018-08-16 MED ORDER — ZOLPIDEM TARTRATE 5 MG PO TABS: 5.0000 mg | ORAL_TABLET | Freq: Every evening | ORAL | Status: DC | PRN

## 2018-08-16 MED ORDER — PRENATAL MULTIVITAMIN CH
1.0000 | ORAL_TABLET | Freq: Every day | ORAL | Status: DC
  Administered 2018-08-16 – 2018-08-18 (×3): 1 via ORAL
  Filled 2018-08-16 (×4): qty 1

## 2018-08-16 MED ORDER — HYDROMORPHONE 1 MG/ML IV SOLN
INTRAVENOUS | Status: DC
  Administered 2018-08-16: 1.8 mg via INTRAVENOUS
  Administered 2018-08-16: 30 mg via INTRAVENOUS
  Administered 2018-08-16: 2.9 mL via INTRAVENOUS
  Administered 2018-08-16: 0.8 mg via INTRAVENOUS
  Administered 2018-08-17 (×2): 0 mg via INTRAVENOUS
  Filled 2018-08-16: qty 30

## 2018-08-16 MED ORDER — DIPHENHYDRAMINE HCL 50 MG/ML IJ SOLN: 12.5000 mg | Freq: Four times a day (QID) | INTRAMUSCULAR | Status: DC | PRN

## 2018-08-16 MED ORDER — ACETAMINOPHEN 325 MG PO TABS: 650.0000 mg | ORAL_TABLET | ORAL | Status: DC | PRN

## 2018-08-16 MED ORDER — LACTATED RINGERS IV SOLN: INTRAVENOUS | Status: DC

## 2018-08-16 MED ORDER — CALCIUM CARBONATE ANTACID 500 MG PO CHEW: 2.0000 | CHEWABLE_TABLET | ORAL | Status: DC | PRN

## 2018-08-16 MED ORDER — SODIUM CHLORIDE 0.9 % IV SOLN
INTRAVENOUS | Status: DC
  Administered 2018-08-16 – 2018-08-17 (×4): via INTRAVENOUS

## 2018-08-16 MED ORDER — SODIUM CHLORIDE 0.9% FLUSH: 9.0000 mL | INTRAVENOUS | Status: DC | PRN

## 2018-08-16 MED ORDER — HYDROMORPHONE HCL 1 MG/ML IJ SOLN
1.0000 mg | Freq: Once | INTRAMUSCULAR | Status: AC
  Administered 2018-08-16: 1 mg via INTRAVENOUS
  Filled 2018-08-16: qty 1

## 2018-08-16 MED ORDER — DOCUSATE SODIUM 100 MG PO CAPS
100.0000 mg | ORAL_CAPSULE | Freq: Every day | ORAL | Status: DC
  Administered 2018-08-17 – 2018-08-18 (×2): 100 mg via ORAL
  Filled 2018-08-16 (×3): qty 1

## 2018-08-16 MED ORDER — DIPHENHYDRAMINE HCL 12.5 MG/5ML PO ELIX
12.5000 mg | ORAL_SOLUTION | Freq: Four times a day (QID) | ORAL | Status: DC | PRN
  Filled 2018-08-16: qty 5

## 2018-08-16 MED ORDER — SODIUM CHLORIDE 0.9 % IV SOLN
2.0000 g | INTRAVENOUS | Status: DC
  Administered 2018-08-16 – 2018-08-17 (×2): 2 g via INTRAVENOUS
  Filled 2018-08-16 (×3): qty 20

## 2018-08-16 MED ORDER — ONDANSETRON HCL 4 MG/2ML IJ SOLN
4.0000 mg | Freq: Four times a day (QID) | INTRAMUSCULAR | Status: DC | PRN
  Administered 2018-08-16: 4 mg via INTRAVENOUS
  Filled 2018-08-16: qty 2

## 2018-08-16 MED ORDER — SODIUM CHLORIDE 0.9 % IV SOLN
INTRAVENOUS | Status: DC
  Administered 2018-08-16: 07:00:00 via INTRAVENOUS

## 2018-08-16 NOTE — MAU Provider Note (Addendum)
Chief Complaint:  Flank Pain   First Provider Initiated Contact with Patient 08/16/18 [phone]     HPI: Andrea Huynh is a [age] y.o. G2P1001 at 536w2dwho presents to maternity admissions reporting severe left flank/middle abdomen pain since 1am.  No other symptoms except nausea due to the pain at times She reports good fetal movement, denies LOF, vaginal bleeding, vaginal itching/burning, h/a, dizziness, n/v, diarrhea, constipation or fever/chills.  Back Pain  This is a new problem. The current episode started today. The problem occurs constantly. Pain location: left flank. Radiates to: rignt middle abdomen. The pain is severe. Associated symptoms include abdominal pain. Pertinent negatives include no dysuria, fever, headaches, pelvic pain or weakness. She has tried analgesics for the symptoms. The treatment provided no relief.    RN note: Sharp left sided back pain that started at 0100.  Took tylenol with no relief.  No VB.  Feeling irregular ctx.  + FM.  No hx of any kidney issues.  No s/s uti.  Past Medical History: Past Medical History:  Diagnosis Date  . Abdominal pain, RLQ 2010  . Gastroparesis JUN 2015 SMART PILL   NO SYMPTOMS  . Solitary rectal ulcer SYNDROME JAN 2013/JUN 2015   TCS-Bx: PROLAPSE/ISCHEMIC CHANGES    Past obstetric history: OB History  Gravida Para Term Preterm AB Living  2 1 1     1   SAB TAB Ectopic Multiple Live Births        0 1    # Outcome Date GA Lbr Len/2nd Weight Sex Delivery Anes PTL Lv  2 Current           1 Term 02/03/17 6111w5d 42:17 / 02:01 3025 g M Vag-Vacuum EPI  LIV    Past Surgical History: Past Surgical History:  Procedure Laterality Date  . COLONOSCOPY  08/04/2011   Procedure: COLONOSCOPY;  Surgeon: Arlyce HarmanSandi M Fields, MD;  [facility];  Service: Endoscopy;  Laterality: N/A;  12:30  . FLEXIBLE SIGMOIDOSCOPY N/A 12/29/2013   Procedure: FLEXIBLE SIGMOIDOSCOPY;  Surgeon: West BaliSandi L Fields, MD;  [facility]: [facility];  Service: Endoscopy;   Laterality: N/A;  12:15    Family History: Family History  Problem Relation Age of Onset  . Constipation Other   . Colon cancer Neg Hx   . Colon polyps Neg Hx   . Stomach cancer Neg Hx     Social History: Social History   Tobacco Use  . Smoking status: Never Smoker  . Smokeless tobacco: Never Used  Substance Use Topics  . Alcohol use: No  . Drug use: No    Allergies: No Known Allergies  Meds:  Medications Prior to Admission  Medication Sig Dispense Refill Last Dose  . hydrocortisone 2.5 % cream APPLY SPARINGLY TWICE DAILY  0   . Prenatal MV-Min-Fe Fum-FA-DHA (PRENATAL 1 PO) Take by mouth.   Taking    I have reviewed patient's Past Medical Hx, Surgical Hx, Family Hx, Social Hx, medications and allergies.   ROS:  Review of Systems  Constitutional: Negative for fever.  Gastrointestinal: Positive for abdominal pain.  Genitourinary: Negative for dysuria and pelvic pain.  Musculoskeletal: Positive for back pain.  Neurological: Negative for weakness and headaches.   Other systems negative  Physical Exam   Patient Vitals for the past 24 hrs:  Height Weight  08/16/18 0629 5\' 5"  (1.651 m) 95 kg   Constitutional: Well-developed, well-nourished female in no acute distress, but writhing in pain, holding left middle abdomen.  Cardiovascular: normal rate  and rhythm Respiratory: normal effort, clear to auscultation bilaterally GI: Abd soft, tender over LLQ/L middle abdomen, gravid appropriate for gestational age.   No rebound or guarding. MS: Extremities nontender, no edema, normal ROM Neurologic: Alert and oriented x 4.  GU: Neg CVAT on right, a little tender on left.   PELVIC EXAM:  deferred  FHT:  Baseline 140 , moderate variability, accelerations present, no decelerations Contractions:   Irregular and Rare   Labs: Results for orders placed or performed during the hospital encounter of 08/16/18 (from the past 24 hour(s))  Urinalysis, Routine w reflex microscopic      Status: Abnormal   Collection Time: 08/16/18  6:38 AM  Result Value Ref Range   Color, Urine YELLOW YELLOW   APPearance HAZY (A) CLEAR   Specific Gravity, Urine 1.020 1.005 - 1.030   pH 6.5 5.0 - 8.0   Glucose, UA NEGATIVE NEGATIVE mg/dL   Hgb urine dipstick MODERATE (A) NEGATIVE   Bilirubin Urine SMALL (A) NEGATIVE   Ketones, ur NEGATIVE NEGATIVE mg/dL   Protein, ur 30 (A) NEGATIVE mg/dL   Nitrite NEGATIVE NEGATIVE   Leukocytes, UA MODERATE (A) NEGATIVE  Urinalysis, Microscopic (reflex)     Status: Abnormal   Collection Time: 08/16/18  6:38 AM  Result Value Ref Range   RBC / HPF 6-10 0 - 5 RBC/hpf   WBC, UA 6-10 0 - 5 WBC/hpf   Bacteria, UA FEW (A) NONE SEEN   Squamous Epithelial / LPF 0-5 0 - 5  CBC with Differential/Platelet     Status: Abnormal   Collection Time: 08/16/18  7:12 AM  Result Value Ref Range   WBC 13.1 (H) 4.0 - 10.5 K/uL   RBC 3.71 (L) 3.87 - 5.11 MIL/uL   Hemoglobin 10.3 (L) 12.0 - 15.0 g/dL   HCT 76.1 (L) 60.7 - 37.1 %   MCV 85.4 80.0 - 100.0 fL   MCH 27.8 26.0 - 34.0 pg   MCHC 32.5 30.0 - 36.0 g/dL   RDW 06.2 69.4 - 85.4 %   Platelets 253 150 - 400 K/uL   nRBC 0.0 0.0 - 0.2 %   Neutrophils Relative % 87 %   Neutro Abs 11.3 (H) 1.7 - 7.7 K/uL   Lymphocytes Relative 11 %   Lymphs Abs 1.4 0.7 - 4.0 K/uL   Monocytes Relative 2 %   Monocytes Absolute 0.3 0.1 - 1.0 K/uL   Eosinophils Relative 0 %   Eosinophils Absolute 0.0 0.0 - 0.5 K/uL   Basophils Relative 0 %   Basophils Absolute 0.0 0.0 - 0.1 K/uL  Comprehensive metabolic panel     Status: Abnormal   Collection Time: 08/16/18  7:12 AM  Result Value Ref Range   Sodium 133 (L) 135 - 145 mmol/L   Potassium 3.6 3.5 - 5.1 mmol/L   Chloride 105 98 - 111 mmol/L   CO2 18 (L) 22 - 32 mmol/L   Glucose, Bld 115 (H) 70 - 99 mg/dL   BUN 9 6 - 20 mg/dL   Creatinine, Ser 6.27 0.44 - 1.00 mg/dL   Calcium 8.0 (L) 8.9 - 10.3 mg/dL   Total Protein 6.7 6.5 - 8.1 g/dL   Albumin 2.7 (L) 3.5 - 5.0 g/dL   AST 18  15 - 41 U/L   ALT 10 0 - 44 U/L   Alkaline Phosphatase 69 38 - 126 U/L   Total Bilirubin 0.4 0.3 - 1.2 mg/dL   GFR calc non Af Amer >60 >60 mL/min  GFR calc Af Amer >60 >60 mL/min   Anion gap 10 5 - 15    Imaging:   MAU Course/MDM: I have ordered labs and reviewed results. Will check CBC and CMET, and UA Renal US ordered to rule out stone NST reviewed, reactive without contractions  Wynelle Bourgeois CNM, MSN Certified Nurse-Midwife 08/16/2018 8:14 AM  Renal US report reviewed:  US Renal  Result Date: 08/16/2018 CLINICAL DATA:  Left flank pain, [redacted] weeks pregnant EXAM: RENAL / URINARY TRACT ULTRASOUND COMPLETE COMPARISON:  None. FINDINGS: Right Kidney: Renal measurements: 14.0 x 5.8 x 7.0 cm = volume: 298 mL . Echogenicity within normal limits. No mass or hydronephrosis visualized. Left Kidney: Renal measurements: 14.3 x 8.3 x 7.3 cm = volume: 455 mL. Normal parenchymal echogenicity. Moderate left hydronephrosis. Bladder: Underdistended. IMPRESSION: Moderate left hydronephrosis. Electronically Signed   By: Charline Bills M.D.   On: 08/16/2018 08:22   Discussed results of [place] and labs with patient. Patient requesting additional pain medication, rates pain 10/10. Reports pain slightly relieved and went to 5/10 after initial dose of Dilaudid. 1mg  Dilaudid ordered.   Consult with Dr Vergie Living on assessment and management. Recommends admission to [facility] for treatment of pyelo, hydronephrosis and uncontrolled flank pain.   Discussed recommendations with Dr Tenny Craw and she agrees with management/plan of care. Will place orders for admission.   Assessment/Plan:  Single intrauterine pregnancy at [redacted]w[redacted]d Left flank/abdominal pain Left moderate hydronephrosis Left sided pyelo   NST reactive   Admit to OB High Risk  Care taken over by Dr Tenny Craw   Sharyon Cable, CNM 08/16/18, 9:21 AM

## 2018-08-16 NOTE — MAU Note (Signed)
Sharp left sided back pain that started at 0100.  Took tylenol with no relief.  No VB.  Feeling irregular ctx.  + FM.  No hx of any kidney issues.  No s/s uti.

## 2018-08-17 LAB — COMPREHENSIVE METABOLIC PANEL
ALT: 9 U/L (ref 0–44)
AST: 15 U/L (ref 15–41)
Albumin: 2.4 g/dL — ABNORMAL LOW (ref 3.5–5.0)
Alkaline Phosphatase: 61 U/L (ref 38–126)
Anion gap: 7 (ref 5–15)
BUN: 7 mg/dL (ref 6–20)
CALCIUM: 8 mg/dL — AB (ref 8.9–10.3)
CO2: 20 mmol/L — ABNORMAL LOW (ref 22–32)
Chloride: 107 mmol/L (ref 98–111)
Creatinine, Ser: 0.58 mg/dL (ref 0.44–1.00)
GFR calc Af Amer: >60 mL/min (ref >60)
GFR calc non Af Amer: >60 mL/min (ref >60)
GLUCOSE: 97 mg/dL (ref 70–99)
Potassium: 3.8 mmol/L (ref 3.5–5.1)
Sodium: 134 mmol/L — ABNORMAL LOW (ref 135–145)
Total Bilirubin: 0.1 mg/dL — ABNORMAL LOW (ref 0.3–1.2)
Total Protein: 5.4 g/dL — ABNORMAL LOW (ref 6.5–8.1)

## 2018-08-17 LAB — CBC
HCT: 28.6 % — ABNORMAL LOW (ref 36.0–46.0)
Hemoglobin: 9.1 g/dL — ABNORMAL LOW (ref 12.0–15.0)
MCH: 27.7 pg (ref 26.0–34.0)
MCHC: 31.8 g/dL (ref 30.0–36.0)
MCV: 87.2 fL (ref 80.0–100.0)
Platelets: 206 10*3/uL (ref 150–400)
RBC: 3.28 MIL/uL — ABNORMAL LOW (ref 3.87–5.11)
RDW: 15.6 % — ABNORMAL HIGH (ref 11.5–15.5)
WBC: 8 10*3/uL (ref 4.0–10.5)
nRBC: 0 % (ref 0.0–0.2)

## 2018-08-17 LAB — CULTURE, OB URINE: Culture: 70000 — AB

## 2018-08-17 MED ORDER — OXYCODONE-ACETAMINOPHEN 5-325 MG PO TABS
1.0000 | ORAL_TABLET | ORAL | Status: DC | PRN
  Administered 2018-08-17: 2 via ORAL
  Administered 2018-08-17: 1 via ORAL
  Filled 2018-08-17: qty 1
  Filled 2018-08-17: qty 2

## 2018-08-17 NOTE — Progress Notes (Signed)
Patient ID: Andrea Huynh, female   DOB: Dec 09, 1990, 28 y.o.   MRN: [id number]   HD#2 28 yo G2P1001 @ 36+3 admitted with pyelonephritis  S: feeling much better, has not required IV pain medication overnight. Active fetal movement O:  Vitals:   08/16/18 2307 08/17/18 0210 08/17/18 0429 08/17/18 0603  BP: 128/72  108/65   Pulse: (!) 109  91   Resp: 16 15 17 12   Temp: 98.4 F (36.9 C)  97.8 F (36.6 C)   TempSrc: Oral  Oral   SpO2: 98% 95% 98% 95%  Weight:      Height:       AOx3, NAD Fhr 140 reactive, cat 1 tracing cvx deferred toco quiet  Results for orders placed or performed during the hospital encounter of 08/16/18 (from the past 24 hour(s))  CBC     Status: Abnormal   Collection Time: 08/17/18  6:07 AM  Result Value Ref Range   WBC 8.0 4.0 - 10.5 K/uL   RBC 3.28 (L) 3.87 - 5.11 MIL/uL   Hemoglobin 9.1 (L) 12.0 - 15.0 g/dL   HCT 20.6 (L) 01.5 - 61.5 %   MCV 87.2 80.0 - 100.0 fL   MCH 27.7 26.0 - 34.0 pg   MCHC 31.8 30.0 - 36.0 g/dL   RDW 37.9 (H) 43.2 - 76.1 %   Platelets 206 150 - 400 K/uL   nRBC 0.0 0.0 - 0.2 %  Comprehensive metabolic panel     Status: Abnormal   Collection Time: 08/17/18  6:07 AM  Result Value Ref Range   Sodium 134 (L) 135 - 145 mmol/L   Potassium 3.8 3.5 - 5.1 mmol/L   Chloride 107 98 - 111 mmol/L   CO2 20 (L) 22 - 32 mmol/L   Glucose, Bld 97 70 - 99 mg/dL   BUN 7 6 - 20 mg/dL   Creatinine, Ser 4.70 0.44 - 1.00 mg/dL   Calcium 8.0 (L) 8.9 - 10.3 mg/dL   Total Protein 5.4 (L) 6.5 - 8.1 g/dL   Albumin 2.4 (L) 3.5 - 5.0 g/dL   AST 15 15 - 41 U/L   ALT 9 0 - 44 U/L   Alkaline Phosphatase 61 38 - 126 U/L   Total Bilirubin 0.1 (L) 0.3 - 1.2 mg/dL   GFR calc non Af Amer >60 >60 mL/min   GFR calc Af Amer >60 >60 mL/min   Anion gap 7 5 - 15   A/P  1) Pyelo: 2nd dose rocephin due 930. Will switch to oral antibiotics after this. Uriine cx pending. WBC resolved 2) D/C PCA 3) FWB reassuring. Change fetal monitoring to NST Q shift 4)  Anticipate D/C tomorrow

## 2018-08-18 MED ORDER — CEPHALEXIN 500 MG PO CAPS: 500.0000 mg | ORAL_CAPSULE | Freq: Four times a day (QID) | ORAL | 0 refills | Status: AC

## 2018-08-18 MED ORDER — FLUCONAZOLE 150 MG PO TABS: 150.0000 mg | ORAL_TABLET | Freq: Every day | ORAL | 0 refills | Status: AC

## 2018-08-18 MED ORDER — CEPHALEXIN 500 MG PO CAPS
500.0000 mg | ORAL_CAPSULE | Freq: Four times a day (QID) | ORAL | Status: DC
  Administered 2018-08-18: 500 mg via ORAL
  Filled 2018-08-18: qty 1

## 2018-08-18 NOTE — Discharge Summary (Signed)
Obstetric Discharge Summary Reason for Admission: pyelonephritis Prenatal Procedures: NST and ultrasound Intrapartum Procedures: NA, undelivered Postpartum Procedures: undelivered Complications-Operative and Postpartum: undelivered Hemoglobin  Date Value Ref Range Status  08/17/2018 9.1 (L) 12.0 - 15.0 g/dL Final   HCT  Date Value Ref Range Status  08/17/2018 28.6 (L) 36.0 - 46.0 % Final    Physical Exam:  General: alert, cooperative and appears stated age FHR 130, reactive NST, cat 1 tracing  Discharge Diagnoses: flank pain, presumed pyelonephritis, final culture contaminated sample  Discharge Information: Date: 08/18/2018 Activity: unrestricted Diet: routine Medications: PNV and keflex Condition: improved Instructions: refer to practice specific booklet Discharge to: home Follow-up Information    Waynard Reeds, MD Follow up on 08/21/2018.   Specialty:  Obstetrics and Gynecology Contact information: [address] [place] [address] [phone]            Waynard Reeds 08/18/2018, 9:53 AM

## 2018-08-21 DIAGNOSIS — Z3480 Encounter for supervision of other normal pregnancy, unspecified trimester: Secondary | ICD-10-CM | POA: Diagnosis not present

## 2018-09-03 DIAGNOSIS — O368139 Decreased fetal movements, third trimester, other fetus: Secondary | ICD-10-CM | POA: Diagnosis not present

## 2018-09-13 DIAGNOSIS — O480 Post-term pregnancy: Secondary | ICD-10-CM | POA: Diagnosis not present

## 2018-09-18 ENCOUNTER — Inpatient Hospital Stay (HOSPITAL_COMMUNITY): Payer: 59 | Admitting: Anesthesiology

## 2018-09-18 ENCOUNTER — Encounter (HOSPITAL_COMMUNITY): Payer: Self-pay

## 2018-09-18 ENCOUNTER — Other Ambulatory Visit: Payer: Self-pay

## 2018-09-18 ENCOUNTER — Inpatient Hospital Stay (HOSPITAL_COMMUNITY)
Admission: AD | Admit: 2018-09-18 | Discharge: 2018-09-19 | DRG: 807 | Disposition: A | Discharge status: Home / Self Care | Payer: 59 | Attending: Obstetrics and Gynecology | Admitting: Obstetrics and Gynecology

## 2018-09-18 ENCOUNTER — Inpatient Hospital Stay (HOSPITAL_COMMUNITY): Admission: RE | Admit: 2018-09-18 | Payer: 59 | Source: Ambulatory Visit

## 2018-09-18 DIAGNOSIS — O480 Post-term pregnancy: Secondary | ICD-10-CM | POA: Diagnosis not present

## 2018-09-18 DIAGNOSIS — Z3490 Encounter for supervision of normal pregnancy, unspecified, unspecified trimester: Secondary | ICD-10-CM

## 2018-09-18 DIAGNOSIS — Z3A41 41 weeks gestation of pregnancy: Secondary | ICD-10-CM | POA: Diagnosis not present

## 2018-09-18 LAB — CBC
HCT: 33.3 % — ABNORMAL LOW (ref 36.0–46.0)
Hemoglobin: 10.4 g/dL — ABNORMAL LOW (ref 12.0–15.0)
MCH: 26.5 pg (ref 26.0–34.0)
MCHC: 31.2 g/dL (ref 30.0–36.0)
MCV: 84.9 fL (ref 80.0–100.0)
Platelets: 220 10*3/uL (ref 150–400)
RBC: 3.92 MIL/uL (ref 3.87–5.11)
RDW: 16 % — ABNORMAL HIGH (ref 11.5–15.5)
WBC: 8.6 10*3/uL (ref 4.0–10.5)
nRBC: 0 % (ref 0.0–0.2)

## 2018-09-18 LAB — TYPE AND SCREEN
ABO/RH(D): A POS
Antibody Screen: NEGATIVE

## 2018-09-18 LAB — RPR: RPR Ser Ql: NONREACTIVE

## 2018-09-18 LAB — ABO/RH: ABO/RH(D): A POS

## 2018-09-18 MED ORDER — OXYCODONE-ACETAMINOPHEN 5-325 MG PO TABS: 2.0000 | ORAL_TABLET | ORAL | Status: DC | PRN

## 2018-09-18 MED ORDER — LIDOCAINE HCL (PF) 1 % IJ SOLN
INTRAMUSCULAR | Status: DC | PRN
  Administered 2018-09-18: 6 mL via EPIDURAL

## 2018-09-18 MED ORDER — ACETAMINOPHEN 325 MG PO TABS: 650.0000 mg | ORAL_TABLET | ORAL | Status: DC | PRN

## 2018-09-18 MED ORDER — LACTATED RINGERS IV SOLN
500.0000 mL | Freq: Once | INTRAVENOUS | Status: AC
  Administered 2018-09-18: 500 mL via INTRAVENOUS

## 2018-09-18 MED ORDER — ZOLPIDEM TARTRATE 5 MG PO TABS: 5.0000 mg | ORAL_TABLET | Freq: Every evening | ORAL | Status: DC | PRN

## 2018-09-18 MED ORDER — IBUPROFEN 600 MG PO TABS
600.0000 mg | ORAL_TABLET | Freq: Four times a day (QID) | ORAL | Status: DC
  Administered 2018-09-18 – 2018-09-19 (×3): 600 mg via ORAL
  Filled 2018-09-18 (×4): qty 1

## 2018-09-18 MED ORDER — OXYTOCIN 40 UNITS IN NORMAL SALINE INFUSION - SIMPLE MED
1.0000 m[IU]/min | INTRAVENOUS | Status: DC
  Administered 2018-09-18: 2 m[IU]/min via INTRAVENOUS
  Filled 2018-09-18: qty 1000

## 2018-09-18 MED ORDER — BENZOCAINE-MENTHOL 20-0.5 % EX AERO
1.0000 "application " | INHALATION_SPRAY | CUTANEOUS | Status: DC | PRN
  Administered 2018-09-18: 1 via TOPICAL
  Filled 2018-09-18: qty 56

## 2018-09-18 MED ORDER — OXYCODONE-ACETAMINOPHEN 5-325 MG PO TABS: 1.0000 | ORAL_TABLET | ORAL | Status: DC | PRN

## 2018-09-18 MED ORDER — SOD CITRATE-CITRIC ACID 500-334 MG/5ML PO SOLN: 30.0000 mL | ORAL | Status: DC | PRN

## 2018-09-18 MED ORDER — SENNOSIDES-DOCUSATE SODIUM 8.6-50 MG PO TABS
2.0000 | ORAL_TABLET | ORAL | Status: DC
  Administered 2018-09-19: 2 via ORAL
  Filled 2018-09-18: qty 2

## 2018-09-18 MED ORDER — PHENYLEPHRINE 40 MCG/ML (10ML) SYRINGE FOR IV PUSH (FOR BLOOD PRESSURE SUPPORT)
80.0000 ug | PREFILLED_SYRINGE | INTRAVENOUS | Status: DC | PRN
  Filled 2018-09-18: qty 10

## 2018-09-18 MED ORDER — ONDANSETRON HCL 4 MG/2ML IJ SOLN: 4.0000 mg | Freq: Four times a day (QID) | INTRAMUSCULAR | Status: DC | PRN

## 2018-09-18 MED ORDER — ONDANSETRON HCL 4 MG/2ML IJ SOLN: 4.0000 mg | INTRAMUSCULAR | Status: DC | PRN

## 2018-09-18 MED ORDER — DIBUCAINE 1 % RE OINT: 1.0000 "application " | TOPICAL_OINTMENT | RECTAL | Status: DC | PRN

## 2018-09-18 MED ORDER — FENTANYL CITRATE (PF) 100 MCG/2ML IJ SOLN
100.0000 ug | INTRAMUSCULAR | Status: DC | PRN
  Administered 2018-09-18: 100 ug via INTRAVENOUS
  Filled 2018-09-18: qty 2

## 2018-09-18 MED ORDER — MISOPROSTOL 25 MCG QUARTER TABLET
25.0000 ug | ORAL_TABLET | ORAL | Status: DC
  Administered 2018-09-18 (×2): 25 ug via VAGINAL
  Filled 2018-09-18 (×2): qty 1

## 2018-09-18 MED ORDER — WITCH HAZEL-GLYCERIN EX PADS: 1.0000 "application " | MEDICATED_PAD | CUTANEOUS | Status: DC | PRN

## 2018-09-18 MED ORDER — SODIUM CHLORIDE (PF) 0.9 % IJ SOLN
INTRAMUSCULAR | Status: DC | PRN
  Administered 2018-09-18: 12 mL/h via EPIDURAL

## 2018-09-18 MED ORDER — LACTATED RINGERS AMNIOINFUSION
INTRAVENOUS | Status: DC
  Administered 2018-09-18: 13:00:00 via INTRAUTERINE

## 2018-09-18 MED ORDER — LACTATED RINGERS IV SOLN: 500.0000 mL | Freq: Once | INTRAVENOUS | Status: DC

## 2018-09-18 MED ORDER — PHENYLEPHRINE 40 MCG/ML (10ML) SYRINGE FOR IV PUSH (FOR BLOOD PRESSURE SUPPORT): 80.0000 ug | PREFILLED_SYRINGE | INTRAVENOUS | Status: DC | PRN

## 2018-09-18 MED ORDER — LACTATED RINGERS IV SOLN: 500.0000 mL | INTRAVENOUS | Status: DC | PRN

## 2018-09-18 MED ORDER — FENTANYL-BUPIVACAINE-NACL 0.5-0.125-0.9 MG/250ML-% EP SOLN: 12.0000 mL/h | EPIDURAL | Status: DC | PRN

## 2018-09-18 MED ORDER — TETANUS-DIPHTH-ACELL PERTUSSIS 5-2.5-18.5 LF-MCG/0.5 IM SUSP: 0.5000 mL | Freq: Once | INTRAMUSCULAR | Status: DC

## 2018-09-18 MED ORDER — LACTATED RINGERS AMNIOINFUSION: INTRAVENOUS | Status: DC

## 2018-09-18 MED ORDER — TERBUTALINE SULFATE 1 MG/ML IJ SOLN: 0.2500 mg | Freq: Once | INTRAMUSCULAR | Status: DC | PRN

## 2018-09-18 MED ORDER — EPHEDRINE 5 MG/ML INJ: 10.0000 mg | INTRAVENOUS | Status: DC | PRN

## 2018-09-18 MED ORDER — OXYTOCIN 40 UNITS IN NORMAL SALINE INFUSION - SIMPLE MED: 2.5000 [IU]/h | INTRAVENOUS | Status: DC

## 2018-09-18 MED ORDER — LIDOCAINE HCL (PF) 1 % IJ SOLN: 30.0000 mL | INTRAMUSCULAR | Status: DC | PRN

## 2018-09-18 MED ORDER — FENTANYL CITRATE (PF) 100 MCG/2ML IJ SOLN
100.0000 ug | Freq: Once | INTRAMUSCULAR | Status: AC
  Administered 2018-09-18: 100 ug via INTRAVENOUS
  Filled 2018-09-18: qty 2

## 2018-09-18 MED ORDER — LACTATED RINGERS IV SOLN
INTRAVENOUS | Status: DC
  Administered 2018-09-18 (×2): via INTRAVENOUS

## 2018-09-18 MED ORDER — SIMETHICONE 80 MG PO CHEW: 80.0000 mg | CHEWABLE_TABLET | ORAL | Status: DC | PRN

## 2018-09-18 MED ORDER — OXYTOCIN BOLUS FROM INFUSION
500.0000 mL | Freq: Once | INTRAVENOUS | Status: AC
  Administered 2018-09-18: 500 mL via INTRAVENOUS

## 2018-09-18 MED ORDER — DIPHENHYDRAMINE HCL 50 MG/ML IJ SOLN: 12.5000 mg | INTRAMUSCULAR | Status: DC | PRN

## 2018-09-18 MED ORDER — FENTANYL-BUPIVACAINE-NACL 0.5-0.125-0.9 MG/250ML-% EP SOLN
12.0000 mL/h | EPIDURAL | Status: DC | PRN
  Filled 2018-09-18: qty 250

## 2018-09-18 MED ORDER — COCONUT OIL OIL: 1.0000 "application " | TOPICAL_OIL | Status: DC | PRN

## 2018-09-18 MED ORDER — ONDANSETRON HCL 4 MG PO TABS: 4.0000 mg | ORAL_TABLET | ORAL | Status: DC | PRN

## 2018-09-18 MED ORDER — MEASLES, MUMPS & RUBELLA VAC IJ SOLR: 0.5000 mL | Freq: Once | INTRAMUSCULAR | Status: DC

## 2018-09-18 NOTE — Anesthesia Procedure Notes (Deleted)
Epidural

## 2018-09-18 NOTE — Anesthesia Preprocedure Evaluation (Signed)
Anesthesia Evaluation  Patient identified by MRN, date of birth, ID band Patient awake    Reviewed: Allergy & Precautions, NPO status , Patient's Chart, lab work & pertinent test results  Airway Mallampati: II  TM Distance: >3 FB Neck ROM: Full    Dental no notable dental hx. (+) Teeth Intact   Pulmonary neg pulmonary ROS,    Pulmonary exam normal breath sounds clear to auscultation       Cardiovascular negative cardio ROS Normal cardiovascular exam Rhythm:Regular Rate:Normal     Neuro/Psych negative neurological ROS     GI/Hepatic PUD,   Endo/Other    Renal/GU      Musculoskeletal   Abdominal (+) + obese,   Peds  Hematology hgb 10.4 Plt 220   Anesthesia Other Findings   Reproductive/Obstetrics (+) Pregnancy                             Anesthesia Physical Anesthesia Plan  ASA: III  Anesthesia Plan: Epidural   Post-op Pain Management:    Induction:   PONV Risk Score and Plan: Treatment may vary due to age or medical condition  Airway Management Planned:   Additional Equipment:   Intra-op Plan:   Post-operative Plan:   Informed Consent: I have reviewed the patients History and Physical, chart, labs and discussed the procedure including the risks, benefits and alternatives for the proposed anesthesia with the patient or authorized representative who has indicated his/her understanding and acceptance.       Plan Discussed with:   Anesthesia Plan Comments:        Anesthesia Quick Evaluation

## 2018-09-18 NOTE — H&P (Signed)
Andrea Huynh is an 28 y.o. G2P1001 [redacted]w[redacted]d white female who was admitted by Dr. Claiborne Billings for an induction secondary to post term preg. PNC was uncomplicated. FTS- wnl/ OGTT- nl, GBS-  neg  Past Medical History:  Diagnosis Date  . Abdominal pain, RLQ 2010  . Gastroparesis JUN 2015 SMART PILL   NO SYMPTOMS  . 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 COLONOSCOPY  08/04/2011   Procedure: COLONOSCOPY;  Surgeon: Arlyce Harman, MD;  [facility];  Service: Endoscopy;  Laterality: N/A;  12:30  . FLEXIBLE SIGMOIDOSCOPY N/A 12/29/2013   Procedure: FLEXIBLE SIGMOIDOSCOPY;  Surgeon: West Bali, MD;  [facility];  Service: Endoscopy;  Laterality: N/A;  12:15    Family History  Problem Relation Age of Onset  . Constipation Other   . Colon cancer Neg Hx   . Colon polyps Neg Hx   . Stomach cancer Neg Hx    Social History:  reports that she has never smoked. She has never used smokeless tobacco. She reports that she does not drink alcohol or use drugs.  Allergies: No Known Allergies  Medications Prior to Admission  Medication Sig Dispense Refill  . Prenatal MV-Min-Fe Fum-FA-DHA (PRENATAL 1 PO) Take by mouth.         Blood pressure 103/61, pulse 79, temperature 97.9 F (36.6 C), temperature source Oral, resp. rate 20, height 5\' 5"  (1.651 m), weight 95.7 kg, SpO2 96 %, currently breastfeeding. General appearance: alert Abdomen: gravid- non tender  Cx 30/3/-2 vtx AROM- no fluid seen   Lab Results  Component Value Date   WBC 8.6 09/18/2018   HGB 10.4 (L) 09/18/2018   HCT 33.3 (L) 09/18/2018   MCV 84.9 09/18/2018   PLT 220 09/18/2018   Lab Results  Component Value Date   HCGQUANT <1 03/31/2016      Imp/ IUP at 41 weeks Plan/ Start induction ANDERSON,MARK E 09/18/2018, 9:33 AM

## 2018-09-18 NOTE — Anesthesia Procedure Notes (Addendum)
Epidural Patient location during procedure: OB Start time: 09/18/2018 10:41 AM End time: 09/18/2018 11:00 AM  Staffing Anesthesiologist: Trevor Iha, MD Performed: anesthesiologist   Preanesthetic Checklist Completed: patient identified, site marked, surgical consent, pre-op evaluation, timeout performed, IV checked, risks and benefits discussed and monitors and equipment checked  Epidural Patient position: sitting Prep: site prepped and draped and DuraPrep Patient monitoring: continuous pulse ox and blood pressure Approach: midline Location: L3-L4 Injection technique: LOR air  Needle:  Needle type: Tuohy  Needle gauge: 17 G Needle length: 9 cm and 9 Needle insertion depth: 7 cm Catheter type: closed end flexible Catheter size: 19 Gauge Catheter at skin depth: 12 cm Test dose: negative  Assessment Events: blood not aspirated, injection not painful, no injection resistance, negative IV test and no paresthesia  Additional Notes Patient identified. Risks/Benefits/Options discussed with patient including but not limited to bleeding, infection, nerve damage, paralysis, failed block, incomplete pain control, headache, blood pressure changes, nausea, vomiting, reactions to medication both or allergic, itching and postpartum back pain. Confirmed with bedside nurse the patient's most recent platelet count. Confirmed with patient that they are not currently taking any anticoagulation, have any bleeding history or any family history of bleeding disorders. Patient expressed understanding and wished to proceed. All questions were answered. Sterile technique was used throughout the entire procedure. Please see nursing notes for vital signs. Test dose was given through epidural needle and negative prior to continuing to dose epidural or start infusion. Warning signs of high block given to the patient including shortness of breath, tingling/numbness in hands, complete motor block, or any  concerning symptoms with instructions to call for help. Patient was given instructions on fall risk and not to get out of bed. All questions and concerns addressed with instructions to call with any issues. 2 Attempt (S) . Patient tolerated procedure well.

## 2018-09-18 NOTE — Lactation Note (Signed)
This note was copied from a baby's chart. Lactation Consultation Note  Patient Name: Andrea Huynh UXNAT'F Date: 09/18/2018  P2, 8 hour female infant. LC entered room Mom and infant asleep.    Maternal Data    Feeding    LATCH Score                   Interventions    Lactation Tools Discussed/Used     Consult Status      Danelle Earthly 09/18/2018, 11:04 PM

## 2018-09-18 NOTE — Anesthesia Pain Management Evaluation Note (Signed)
  CRNA Pain Management Visit Note  Patient: Andrea Huynh, 28 y.o., female  "Hello I am a member of the anesthesia team at [facility]. We have an anesthesia team available at all times to provide care throughout the hospital, including epidural management and anesthesia for C-section. I don't know your plan for the delivery whether it a natural birth, water birth, IV sedation, nitrous supplementation, doula or epidural, but we want to meet your pain goals."   1.Was your pain managed to your expectations on prior hospitalizations?   Yes   2.What is your expectation for pain management during this hospitalization?     Epidural  3.How can we help you reach that goal?   Record the patient's initial score and the patient's pain goal.   Pain: 0  Pain Goal: 3 [facility] wants you to be able to say your pain was always managed very well.  Laban Emperor 09/18/2018

## 2018-09-19 LAB — CBC
HCT: 27.7 % — ABNORMAL LOW (ref 36.0–46.0)
Hemoglobin: 9 g/dL — ABNORMAL LOW (ref 12.0–15.0)
MCH: 27.4 pg (ref 26.0–34.0)
MCHC: 32.5 g/dL (ref 30.0–36.0)
MCV: 84.5 fL (ref 80.0–100.0)
Platelets: 194 10*3/uL (ref 150–400)
RBC: 3.28 MIL/uL — ABNORMAL LOW (ref 3.87–5.11)
RDW: 16 % — ABNORMAL HIGH (ref 11.5–15.5)
WBC: 11.2 10*3/uL — AB (ref 4.0–10.5)
nRBC: 0 % (ref 0.0–0.2)

## 2018-09-19 NOTE — Lactation Note (Signed)
This note was copied from a baby's chart. Lactation Consultation Note  Patient Name: Andrea Huynh NOIBB'C Date: 09/19/2018 Reason for consult: Initial assessment;Term  Visited with P2 Mom of term baby at 46 hrs old.  Baby has had 5 formula feedings, but Mom's goal is to exclusively breastfeed.  Offered to assist with latch, even though baby had just had 5 ml formula.    Reviewed positioning and latching in cross cradle, and football holds.  Baby opens wide, and Mom shown how to bring baby quickly to breast.  Baby took a few sucks and fell asleep.    Educated Mom and FOB about importance of keeping baby STS, and offering the breast when baby cues.  Recommended they don't give baby formula unless baby is unable to latch.  Mom states baby can latch, but she was worried that she didn't have milk.  Reviewed breast massage and hand expression, colostrum drops expressed.  Mom aware of size of baby's stomach, importance of small frequent feedings.    Mom to call for assistance with positioning and latching.  Interventions Interventions: Breast feeding basics reviewed;Assisted with latch;Skin to skin;Breast massage;Hand express;Breast compression;Adjust position;Support pillows;Position options   Consult Status Consult Status: Follow-up Date: 09/20/18 Follow-up type: In-patient    [name] 09/19/2018, 1:55 PM

## 2018-09-19 NOTE — Anesthesia Postprocedure Evaluation (Signed)
Anesthesia Post Note  Patient: Andrea Huynh  Procedure(s) Performed: AN AD HOC LABOR EPIDURAL     Patient location during evaluation: Mother Baby Anesthesia Type: Epidural Level of consciousness: awake and alert and oriented Pain management: satisfactory to patient Vital Signs Assessment: post-procedure vital signs reviewed and stable Respiratory status: respiratory function stable Cardiovascular status: stable Postop Assessment: no headache, no backache, epidural receding, patient able to bend at knees, no signs of nausea or vomiting and adequate PO intake Anesthetic complications: no    Last Vitals:  Vitals:   09/19/18 0130 09/19/18 0530  BP: 116/74 121/68  Pulse: 71 70  Resp: 18 18  Temp: 36.8 C 36.9 C  SpO2: 98% 99%    Last Pain:  Vitals:   09/19/18 1126  TempSrc:   PainSc: 0-No pain   Pain Goal:                   Andrea Huynh

## 2018-09-19 NOTE — Progress Notes (Signed)
Patient is eating, ambulating, voiding.  Pain control is good.  Vitals:   09/18/18 1730 09/18/18 2115 09/19/18 0130 09/19/18 0530  BP: 128/70 114/76 116/74 121/68  Pulse: 84 71 71 70  Resp: 18 18 18 18   Temp: 98 F (36.7 C) 98.1 F (36.7 C) 98.2 F (36.8 C) 98.4 F (36.9 C)  TempSrc: Axillary Oral Oral Oral  SpO2: 99%  98% 99%  Weight:      Height:        Fundus firm Perineum without swelling.  Lab Results  Component Value Date   WBC 11.2 (H) 09/19/2018   HGB 9.0 (L) 09/19/2018   HCT 27.7 (L) 09/19/2018   MCV 84.5 09/19/2018   PLT 194 09/19/2018    --/--/A POS, A POS Performed at [facility], [address] [place], [place] [address]  (03/11 0114)/RI  A/P Post partum day 1.  Routine care.  Expect d/c routine.    Loney Laurence

## 2018-09-19 NOTE — Progress Notes (Signed)
MOB was referred for history of depression/anxiety.  * Referral screened out by Clinical Social Worker because none of the following criteria appear to apply:  -History of anxiety/depression during this pregnancy, or of post-partum depression following prior delivery. -Diagnosis of anxiety and/or depression within last 3 years. Per note from CSW on 02/04/2017, MOB stated diagnosis was from several years prior and is no longer experiencing symptoms.  OR * MOB's symptoms currently being treated with medication and/or therapy.  Please contact the Clinical Social Worker if needs arise, by MOB request, or if MOB scores greater than 9/yes to question 10 on Edinburgh Postpartum Depression Screen.  Adonis Ryther Irwin, LCSWA  [facility] [phone]  

## 2018-10-08 NOTE — Discharge Summary (Signed)
Obstetric Discharge Summary Reason for Admission: induction of labor Prenatal Procedures: NST Intrapartum Procedures: spontaneous vaginal delivery Postpartum Procedures: none Complications-Operative and Postpartum: 2 degree perineal laceration Hemoglobin  Date Value Ref Range Status  09/19/2018 9.0 (L) 12.0 - 15.0 g/dL Final   HCT  Date Value Ref Range Status  09/19/2018 27.7 (L) 36.0 - 46.0 % Final     DVT Evaluation: No evidence of DVT seen on physical exam.  Discharge Diagnoses: Term Pregnancy-delivered  Discharge Information: Date: 10/08/2018 Activity: unrestricted Diet: routine Medications: Ibuprofen, iron Condition: stable Instructions: refer to practice specific booklet Discharge to: home Follow-up Information    Levi Aland, MD Follow up in 4 week(s).   Specialty:  Obstetrics and Gynecology Contact information: [address] [place] [address] [phone]           Newborn Data: Live born female  Birth Weight: 8 lb 3.8 oz (3735 g) APGAR: 9, 9  Newborn Delivery   Birth date/time:  09/18/2018 14:48:00 Delivery type:  Vaginal, Spontaneous     Home with mother.  Loney Laurence 10/08/2018, 7:51 AM

## 2018-10-15 DIAGNOSIS — Z124 Encounter for screening for malignant neoplasm of cervix: Secondary | ICD-10-CM | POA: Diagnosis not present

## 2018-12-05 ENCOUNTER — Ambulatory Visit: Payer: 59 | Admitting: Family Medicine

## 2018-12-05 ENCOUNTER — Other Ambulatory Visit: Payer: Self-pay

## 2018-12-05 ENCOUNTER — Encounter: Payer: Self-pay | Admitting: Family Medicine

## 2018-12-05 VITALS — BP 122/80 | HR 82 | Temp 97.8°F | Ht 65.0 in | Wt 191.0 lb

## 2018-12-05 DIAGNOSIS — F53 Postpartum depression: Secondary | ICD-10-CM | POA: Diagnosis not present

## 2018-12-05 DIAGNOSIS — O99345 Other mental disorders complicating the puerperium: Secondary | ICD-10-CM

## 2018-12-05 MED ORDER — SERTRALINE HCL 50 MG PO TABS: 50.0000 mg | ORAL_TABLET | Freq: Every day | ORAL | 3 refills | Status: DC

## 2018-12-05 NOTE — Patient Instructions (Signed)
If your symptoms worsen or you have thoughts of suicide/homicide, PLEASE SEEK IMMEDIATE MEDICAL ATTENTION.  You may always call the National Suicide Hotline.  This is available 24 hours a day, 7 days a week.  Their number is: 1-800-273-8255  Taking the medicine as directed and not missing any doses is one of the best things you can do to treat your depression.  Here are some things to keep in mind:  1) Side effects (stomach upset, some increased anxiety) may happen before you notice a benefit.  These side effects typically go away over time. 2) Changes to your dose of medicine or a change in medication all together is sometimes necessary 3) Most people need to be on medication at least 12 months 4) Many people will notice an improvement within two weeks but the full effect of the medication can take up to 4-6 weeks 5) Stopping the medication when you start feeling better often results in a return of symptoms 6) Never discontinue your medication without contacting a health care professional first.  Some medications require gradual discontinuation/ taper and can make you sick if you stop them abruptly.  If your symptoms worsen or you have thoughts of suicide/homicide, PLEASE SEEK IMMEDIATE MEDICAL ATTENTION.  You may always call:  National Suicide Hotline: 800-273-8255 [place] Crisis Line: 336-832-9700 Crisis Recovery in [place]: 800-939-5911   These are available 24 hours a day, 7 days a week.   

## 2018-12-05 NOTE — Progress Notes (Signed)
Subjective:   Andrea Huynh is an 28 y.o. female who presents for evaluation and treatment of depressive symptoms.  Onset approximately 5 weeks ago, gradually worsening since that time. She is 8 weeks postpartal. She denies postpartum depression with her first child. Did have depression as a teenager. Has not been on medications in several years. She is not breastfeeding.  Current symptoms include depressed mood, anhedonia, hypersomnia, fatigue, feelings of worthlessness/guilt, difficulty concentrating, hopelessness, impaired memory, loss of energy/fatigue, decreased appetite,.  Current treatment for depression:None Sleep problems: Mild   Early awakening:Absent   Energy: Poor Motivation: Poor Concentration: Poor Rumination/worrying: Moderate and Marked Memory: Fair Tearfulness: Mild  Anxiety: Mild  Panic: Absent  Overall Mood: Moderately worse  Hopelessness: Severe Suicidal ideation: Absent  Other/Psychosocial Stressors: none Family history negative for depression. Previous treatment modalities employed include None.  Past episodes of depression:yes, as a teenager Organic causes of depression present: None. Previous TSH normal.  Depression screen [facility] 2/9 12/05/2018 03/20/2018 11/05/2017 06/27/2017 03/31/2016  Decreased Interest 2 0 0 0 0  Down, Depressed, Hopeless 2 0 0 0 0  PHQ - 2 Score 4 0 0 0 0  Altered sleeping 2 - - - -  Tired, decreased energy 2 - - - -  Change in appetite 3 - - - -  Feeling bad or failure about yourself  2 - - - -  Trouble concentrating 0 - - - -  Moving slowly or fidgety/restless 0 - - - -  Suicidal thoughts 1 - - - -  PHQ-9 Score 14 - - - -    Review of Systems Constitutional: positive for anorexia and fatigue Respiratory: negative Cardiovascular: negative Gastrointestinal: negative Neurological: positive for headaches Behavioral/Psych: positive for bad mood, decreased appetite, depression, fatigue, irritability, loss of interest in  favorite activities and mood swings   Objective:   Mental Status Examination: Posture and motor behavior: Appropriate Dress, grooming, personal hygiene: Appropriate Facial expression: Appropriate Speech: Appropriate Mood: Positive for depressed mood Coherency and relevance of thought: Appropriate Thought content: Appropriate Perceptions: Appropriate Orientation:Appropriate Attention and concentration: Appropriate Memory: : Appropriate Vocabulary: Appropriate Abstract reasoning: Appropriate Judgment: Appropriate    Assessment:    Andrea Huynh was seen today for postpartum depression.  Diagnoses and all orders for this visit:  Postpartum depression Will initiate sertraline today. Report any new or worsening symptoms. Follow up in 2 weeks. Hotline information provided.  -     sertraline (ZOLOFT) 50 MG tablet; Take 1 tablet (50 mg total) by mouth daily.   Experiencing the following symptoms of depression most of the day nearly every day for more than two consecutive weeks: depressed mood, loss of interests/pleasure, change in sleep, change in appetite or weight, loss of energy, trouble concentrating, thoughts of worthlessness or guilt   Suicide Risk Assessment:  Suicidal intent: no Suicidal plan: no Access to means for suicide: no Lethality of means for suicide: no Prior suicide attempts: no Recent exposure to suicide:no    Plan:   Postpartum depression - Plan: sertraline (ZOLOFT) 50 MG tablet  Reviewed concept of depression as biochemical imbalance of neurotransmitters and rationale for treatment. Instructed patient to contact office or on-call physician promptly should condition worsen or any new symptoms appear and provided on-call telephone numbers.     Return in about 2 weeks (around 12/19/2018), or if symptoms worsen or fail to improve, for depression.  The above assessment and management plan was discussed with the patient. The patient verbalized understanding of and has  agreed to the management plan. Patient is aware to call the clinic if symptoms fail to improve or worsen. Patient is aware when to return to the clinic for a follow-up visit. Patient educated on when it is appropriate to go to the emergency department.   Kari Baars, FNP-C [facility] [address] [place], [place] [address] [phone]

## 2018-12-20 ENCOUNTER — Encounter: Payer: Self-pay | Admitting: Physician Assistant

## 2018-12-20 ENCOUNTER — Ambulatory Visit: Payer: 59 | Admitting: Physician Assistant

## 2018-12-20 ENCOUNTER — Other Ambulatory Visit: Payer: Self-pay

## 2018-12-20 DIAGNOSIS — F53 Postpartum depression: Secondary | ICD-10-CM | POA: Diagnosis not present

## 2018-12-20 DIAGNOSIS — O99345 Other mental disorders complicating the puerperium: Secondary | ICD-10-CM | POA: Diagnosis not present

## 2018-12-20 MED ORDER — SERTRALINE HCL 50 MG PO TABS: 50.0000 mg | ORAL_TABLET | Freq: Every day | ORAL | 5 refills | Status: DC

## 2018-12-20 NOTE — Progress Notes (Signed)
BP 126/86   Pulse 86   Temp 99.6 F (37.6 C) (Oral)   Wt 188 lb 12.8 oz (85.6 kg)   BMI 31.42 kg/m    Subjective:    Patient ID: Andrea Huynh, female    DOB: [birth year], 28 y.o.   MRN: [id number]  HPI: Andrea Huynh is a 28 y.o. female presenting on 12/20/2018 for Depression (2 week rck )  Patient comes in for 2-week recheck on her depression.  She was started on Zoloft.  This was a antidepressant she had taken in the past and tolerated well.  She states she is feeling much better already.  She has 3 months postpartum and knows that has a lot to do with her stress and depression.  Plus COVID-19 restrictions have been going on.  Depression screen [facility] 2/9 12/20/2018 12/05/2018 03/20/2018 11/05/2017 06/27/2017  Decreased Interest 0 2 0 0 0  Down, Depressed, Hopeless 0 2 0 0 0  PHQ - 2 Score 0 4 0 0 0  Altered sleeping - 2 - - -  Tired, decreased energy - 2 - - -  Change in appetite - 3 - - -  Feeling bad or failure about yourself  - 2 - - -  Trouble concentrating - 0 - - -  Moving slowly or fidgety/restless - 0 - - -  Suicidal thoughts - 1 - - -  PHQ-9 Score - 14 - - -     Past Medical History:  Diagnosis Date  . Abdominal pain, RLQ 2010  . Gastroparesis JUN 2015 SMART PILL   NO SYMPTOMS  . Solitary rectal ulcer SYNDROME JAN 2013/JUN 2015   TCS-Bx: PROLAPSE/ISCHEMIC CHANGES   Relevant past medical, surgical, family and social history reviewed and updated as indicated. Interim medical history since our last visit reviewed. Allergies and medications reviewed and updated. DATA REVIEWED: CHART IN EPIC  Family History reviewed for pertinent findings.  Review of Systems  Constitutional: Negative.   HENT: Negative.   Eyes: Negative.   Respiratory: Negative.   Gastrointestinal: Negative.   Genitourinary: Negative.     Allergies as of 12/20/2018   No Known Allergies     Medication List       Accurate as of December 20, 2018  2:19 PM. If you have any questions, ask  your nurse or doctor.        sertraline 50 MG tablet Commonly known as: ZOLOFT Take 1 tablet (50 mg total) by mouth daily.          Objective:    BP 126/86   Pulse 86   Temp 99.6 F (37.6 C) (Oral)   Wt 188 lb 12.8 oz (85.6 kg)   BMI 31.42 kg/m   No Known Allergies  Wt Readings from Last 3 Encounters:  12/20/18 188 lb 12.8 oz (85.6 kg)  12/05/18 191 lb (86.6 kg)  09/18/18 211 lb (95.7 kg)    Physical Exam Constitutional:      Appearance: She is well-developed.  HENT:     Head: Normocephalic and atraumatic.  Eyes:     Conjunctiva/sclera: Conjunctivae normal.     Pupils: Pupils are equal, round, and reactive to light.  Cardiovascular:     Rate and Rhythm: Normal rate and regular rhythm.     Heart sounds: Normal heart sounds.  Pulmonary:     Effort: Pulmonary effort is normal.  Skin:    General: Skin is warm and dry.     Findings: No rash.  Neurological:     Mental Status: She is alert and oriented to person, place, and time.     Deep Tendon Reflexes: Reflexes are normal and symmetric.  Psychiatric:        Behavior: Behavior normal.         Assessment & Plan:   1. Postpartum depression Continue treatment for at least 6 months.  Near the end of that time.  I given her instructions on how to taper off back to 1/2 tablet a daily for 1 week and then half tablet every other day for 1 week.  If the symptoms return she is to start back on the medication. - sertraline (ZOLOFT) 50 MG tablet; Take 1 tablet (50 mg total) by mouth daily.  Dispense: 30 tablet; Refill: 5   Continue all other maintenance medications as listed above.  Follow up plan: No follow-ups on file.  Educational handout given for survey  Remus LofflerAngel S. Syenna Nazir PA-C [facility] [address]  [place], [place] [id number] [phone]   12/20/2018, 2:19 PM

## 2019-02-03 ENCOUNTER — Other Ambulatory Visit: Payer: Self-pay | Admitting: Physician Assistant

## 2019-02-03 ENCOUNTER — Telehealth: Payer: Self-pay | Admitting: Physician Assistant

## 2019-02-03 DIAGNOSIS — Z20828 Contact with and (suspected) exposure to other viral communicable diseases: Secondary | ICD-10-CM

## 2019-02-03 DIAGNOSIS — Z20822 Contact with and (suspected) exposure to covid-19: Secondary | ICD-10-CM

## 2019-02-03 NOTE — Telephone Encounter (Signed)
Can we put in order for testing?

## 2019-02-03 NOTE — Telephone Encounter (Signed)
Ordered

## 2019-02-04 NOTE — Telephone Encounter (Signed)
Patient aware order placed  

## 2019-02-05 ENCOUNTER — Other Ambulatory Visit: Payer: Self-pay | Admitting: Physician Assistant

## 2019-02-05 DIAGNOSIS — Z20822 Contact with and (suspected) exposure to covid-19: Secondary | ICD-10-CM

## 2019-02-06 LAB — NOVEL CORONAVIRUS, NAA: SARS-CoV-2, NAA: NOT DETECTED

## 2019-02-08 ENCOUNTER — Encounter (INDEPENDENT_AMBULATORY_CARE_PROVIDER_SITE_OTHER): Payer: Self-pay

## 2019-05-16 ENCOUNTER — Other Ambulatory Visit: Payer: Self-pay

## 2019-05-16 DIAGNOSIS — Z20822 Contact with and (suspected) exposure to covid-19: Secondary | ICD-10-CM

## 2019-05-17 LAB — NOVEL CORONAVIRUS, NAA: SARS-CoV-2, NAA: NOT DETECTED

## 2019-05-19 ENCOUNTER — Other Ambulatory Visit: Payer: Self-pay

## 2019-05-19 DIAGNOSIS — Z20822 Contact with and (suspected) exposure to covid-19: Secondary | ICD-10-CM

## 2019-05-20 ENCOUNTER — Encounter: Payer: Self-pay | Admitting: Family Medicine

## 2019-05-20 ENCOUNTER — Ambulatory Visit (INDEPENDENT_AMBULATORY_CARE_PROVIDER_SITE_OTHER): Payer: 59 | Admitting: Family Medicine

## 2019-05-20 ENCOUNTER — Other Ambulatory Visit: Payer: Self-pay

## 2019-05-20 DIAGNOSIS — L03211 Cellulitis of face: Secondary | ICD-10-CM | POA: Diagnosis not present

## 2019-05-20 LAB — NOVEL CORONAVIRUS, NAA: SARS-CoV-2, NAA: NOT DETECTED

## 2019-05-20 MED ORDER — SULFAMETHOXAZOLE-TRIMETHOPRIM 800-160 MG PO TABS: 1.0000 | ORAL_TABLET | Freq: Two times a day (BID) | ORAL | 0 refills | Status: AC

## 2019-05-20 NOTE — Patient Instructions (Signed)

## 2019-05-20 NOTE — Progress Notes (Signed)
   Virtual Visit via Telephone Note  I connected with Andrea Huynh on 05/20/19 at 8:22 AM by telephone and verified that I am speaking with the correct person using two identifiers. Andrea Huynh is currently located at home and nobody is currently with her during this visit. The provider, Loman Brooklyn, FNP is located in their home at time of visit.  I discussed the limitations, risks, security and privacy concerns of performing an evaluation and management service by telephone and the availability of in person appointments. I also discussed with the patient that there may be a patient responsible charge related to this service. The patient expressed understanding and agreed to proceed.  Subjective: PCP: Terald Sleeper, PA-C  Chief Complaint  Patient presents with  . Oral Swelling   Patient reports a small bump on her chin that started on Saturday (three days ago). The swelling progressed to her lip yesterday and today it is even worse. She is having a hard time eating or drinking due to the pain. She has tried Benadryl, Tylenol, warm salt water gargles, warm compresses, eating sour candy (if it was the salivary gland), and Amoxicillin that she had left over from a root canal (5-6 pills). She does have a low grade fever per her report. There is warmth and erythema.    ROS: Per HPI  Current Outpatient Medications:  .  sertraline (ZOLOFT) 50 MG tablet, Take 1 tablet (50 mg total) by mouth daily., Disp: 30 tablet, Rfl: 5  No Known Allergies Past Medical History:  Diagnosis Date  . Abdominal pain, RLQ 2010  . Gastroparesis JUN 2015 SMART PILL   NO SYMPTOMS  . Solitary rectal ulcer SYNDROME JAN 2013/JUN 2015   TCS-Bx: PROLAPSE/ISCHEMIC CHANGES    Observations/Objective: A&O  No respiratory distress or wheezing audible over the phone Mood, judgement, and thought processes all WNL  Assessment and Plan: 1. Cellulitis of face - Continue warm compresses. Ibuprofen/Advil for pain.  Discussed completing all antibiotics and not stopping because she is feeling better. Explained antibiotic resistance. Education provided on cellulitis.  - sulfamethoxazole-trimethoprim (BACTRIM DS) 800-160 MG tablet; Take 1 tablet by mouth 2 (two) times daily for 7 days.  Dispense: 14 tablet; Refill: 0   Follow Up Instructions:  I discussed the assessment and treatment plan with the patient. The patient was provided an opportunity to ask questions and all were answered. The patient agreed with the plan and demonstrated an understanding of the instructions.   The patient was advised to call back or seek an in-person evaluation if the symptoms worsen or if the condition fails to improve as anticipated.  The above assessment and management plan was discussed with the patient. The patient verbalized understanding of and has agreed to the management plan. Patient is aware to call the clinic if symptoms persist or worsen. Patient is aware when to return to the clinic for a follow-up visit. Patient educated on when it is appropriate to go to the emergency department.   Time call ended: 8:28 AM  I provided 7 minutes of non-face-to-face time during this encounter.  Hendricks Limes, MSN, APRN, FNP-C [facility] 05/20/19

## 2019-06-09 ENCOUNTER — Encounter: Payer: Self-pay | Admitting: Physician Assistant

## 2019-06-09 ENCOUNTER — Ambulatory Visit (INDEPENDENT_AMBULATORY_CARE_PROVIDER_SITE_OTHER): Payer: 59 | Admitting: Physician Assistant

## 2019-06-09 DIAGNOSIS — R21 Rash and other nonspecific skin eruption: Secondary | ICD-10-CM | POA: Diagnosis not present

## 2019-06-09 DIAGNOSIS — T7840XA Allergy, unspecified, initial encounter: Secondary | ICD-10-CM | POA: Diagnosis not present

## 2019-06-09 MED ORDER — PREDNISONE 10 MG (21) PO TBPK: ORAL_TABLET | ORAL | 0 refills | Status: DC

## 2019-06-09 MED ORDER — CLOBETASOL PROPIONATE 0.05 % EX CREA: 1.0000 "application " | TOPICAL_CREAM | Freq: Two times a day (BID) | CUTANEOUS | 0 refills | Status: DC

## 2019-06-09 NOTE — Progress Notes (Signed)
     Telephone visit  Subjective: LT:JQZE in axilla PCP: Terald Sleeper, PA-C SPQ:ZRAQT Andrea Huynh is a 28 y.o. female calls for telephone consult today. Patient provides verbal consent for consult held via phone.  Patient is identified with 2 separate identifiers.  At this time the entire area is on COVID-19 social distancing and stay home orders are in place.  Patient is of higher risk and therefore we are performing this by a virtual method.  Location of patient: home Location of provider: [facility] Others present for call: no  In the last couple days the patient has had an increasing red solid rash in her right axilla.  She did have this problem a year or 2 ago very similarly.  And it did respond to allergy medication.  She states that it does itch at it.  She is antibacterial soap last night and a little bit of hydrocortisone.  And she thinks it may have helped the itching a little bit.  This morning it is still there.  And started to spread down onto her trunk more.     ROS: Per HPI  No Known Allergies Past Medical History:  Diagnosis Date  . Abdominal pain, RLQ 2010  . Gastroparesis JUN 2015 SMART PILL   NO SYMPTOMS  . Solitary rectal ulcer SYNDROME JAN 2013/JUN 2015   TCS-Bx: PROLAPSE/ISCHEMIC CHANGES    Current Outpatient Medications:  .  sertraline (ZOLOFT) 50 MG tablet, Take 1 tablet (50 mg total) by mouth daily., Disp: 30 tablet, Rfl: 5  Assessment/ Plan: 28 y.o. female   1. Rash due to allergy - predniSONE (STERAPRED UNI-PAK 21 TAB) 10 MG (21) TBPK tablet; As directed x 6 days  Dispense: 21 tablet; Refill: 0 - clobetasol cream (TEMOVATE) 0.05 %; Apply 1 application topically 2 (two) times daily.  Dispense: 60 g; Refill: 0   No follow-ups on file.  Continue all other maintenance medications as listed above.  Start time: 8:23 AM End time: 8:30 AM  No orders of the defined types were placed in this encounter.   Particia Nearing PA-C [facility] [phone]

## 2019-06-17 ENCOUNTER — Ambulatory Visit (INDEPENDENT_AMBULATORY_CARE_PROVIDER_SITE_OTHER): Payer: 59 | Admitting: Physician Assistant

## 2019-06-17 ENCOUNTER — Encounter: Payer: Self-pay | Admitting: Physician Assistant

## 2019-06-17 DIAGNOSIS — L509 Urticaria, unspecified: Secondary | ICD-10-CM

## 2019-06-17 MED ORDER — FAMOTIDINE 20 MG PO TABS: 20.0000 mg | ORAL_TABLET | Freq: Two times a day (BID) | ORAL | 5 refills | Status: DC

## 2019-06-17 MED ORDER — LORATADINE 10 MG PO TABS: 10.0000 mg | ORAL_TABLET | Freq: Every day | ORAL | 11 refills | Status: DC

## 2019-06-17 MED ORDER — CETIRIZINE HCL 10 MG PO TABS: 10.0000 mg | ORAL_TABLET | Freq: Every day | ORAL | 11 refills | Status: DC

## 2019-06-17 MED ORDER — PREDNISONE 10 MG (48) PO TBPK: ORAL_TABLET | ORAL | 0 refills | Status: DC

## 2019-06-17 NOTE — Patient Instructions (Signed)
Hives Hives are itchy, red, swollen areas on your skin. Hives can show up on any part of your body. Hives often fade within 24 hours (acute hives). New hives can show up after old ones fade. This can go on for many days or weeks (chronic hives). Hives do not spread from person to person (are not contagious). Hives are caused by your body's response to something that you are allergic to (allergen). These are sometimes called triggers. You can get hives right after being around a trigger, or hours later. What are the causes?  Allergies to foods.  Insect bites or stings.  Pollen.  Pets.  Latex.  Chemicals.  Spending time in sunlight, heat, or cold.  Exercise.  Stress.  Some medicines.  Viruses. This includes the common cold.  Infections caused by germs (bacteria).  Allergy shots.  Blood transfusions. Sometimes, the cause is not known. What increases the risk?  Being a woman.  Being allergic to foods such as: ? Citrus fruits. ? Milk. ? Eggs. ? Peanuts. ? Tree nuts. ? Shellfish.  Being allergic to: ? Medicines. ? Latex. ? Insects. ? Animals. ? Pollen. What are the signs or symptoms?   Raised, itchy, red or white bumps or patches on your skin. These areas may: ? Get large and swollen. ? Change in shape and location. ? Stand alone or connect to each other over a large area of skin. ? Sting or hurt. ? Turn white when pressed in the center (blanch). In very bad cases, your hands, feet, and face may also get swollen. This may happen if hives start deeper in your skin. How is this treated? Treatment for this condition depends on your symptoms. Treatment may include:  Using cool, wet cloths (cool compresses) or taking cool showers to stop the itching.  Medicines that help: ? Relieve itching (antihistamines). ? Reduce swelling (corticosteroids). ? Treat infection (antibiotics).  A medicine (omalizumab) that is given as a shot (injection). Your doctor may  prescribe this if you have hives that do not get better even after other treatments.  In very bad cases, you may need a shot of a medicine called epinephrineto prevent a life-threatening allergic reaction (anaphylaxis). Follow these instructions at home: Medicines  Take or apply over-the-counter and prescription medicines only as told by your doctor.  If you were prescribed an antibiotic medicine, use it as told by your doctor. Do not stop using it even if you start to feel better. Skin care  Apply cool, wet cloths to the hives.  Do not scratch your skin. Do not rub your skin. General instructions  Do not take hot showers or baths. This can make itching worse.  Do not wear tight clothes.  Use sunscreen and wear clothes that cover your skin when you are outside.  Avoid any triggers that cause your hives. Keep a journal to help track what causes your hives. Write down: ? What medicines you take. ? What you eat and drink. ? What products you use on your skin.  Keep all follow-up visits as told by your doctor. This is important. Contact a doctor if:  Your symptoms are not better with medicine.  Your joints hurt or are swollen. Get help right away if:  You have a fever.  You have pain in your belly (abdomen).  Your tongue or lips are swollen.  Your eyelids are swollen.  Your chest or throat feels tight.  You have trouble breathing or swallowing. These symptoms may be an emergency.   Do not wait to see if the symptoms will go away. Get medical help right away. Call your local emergency services (911 in the [place] Do not drive yourself to the hospital. Summary  Hives are itchy, red, swollen areas on your skin.  Treatment for this condition depends on your symptoms.  Avoid things that cause your hives. Keep a journal to help track what causes your hives.  Take and apply over-the-counter and prescription medicines only as told by your doctor.  Keep all follow-up visits  as told by your doctor. This is important. This information is not intended to replace advice given to you by your health care provider. Make sure you discuss any questions you have with your health care provider. Document Released: 04/04/2008 Document Revised: 01/09/2018 Document Reviewed: 01/09/2018 Elsevier Patient Education  2020 [employer].

## 2019-06-17 NOTE — Progress Notes (Signed)
Telephone visit  Subjective: Andrea Huynh is back PCP: Remus Loffler, PA-C BPZ:WCHEN Andrea Huynh is a 28 y.o. female calls for telephone consult today. Patient provides verbal consent for consult held via phone.  Patient is identified with 2 separate identifiers.  At this time the entire area is on COVID-19 social distancing and stay home orders are in place.  Patient is of higher risk and therefore we are performing this by a virtual method.  Location of patient: home Location of provider: [facility] Others present for call: no  Patient was able to send me photographs through social media and I was able to view red confluent patches on her arms and trunk.  There is a raised portion and areas that are with some excoriation from scratching.  When we use the oral prednisone a few weeks ago it did calm down.  However 1 day after finishing the prednisone pack it came right back.  I do feel that this is probably in the urticaria family.  We are not sure what she may be ingesting.  She reports no changes in any of her household chemicals or topical products.  It is explained that we will use Claritin, Zyrtec, Pepcid, 12-day prednisone pack.  If she does not get relief she is to give [place] a call in a week.  And after a month she can start to discontinue 1 at a time the antihistamine, and if it returns we will pursue allergy testing.   ROS: Per HPI  No Known Allergies Past Medical History:  Diagnosis Date  . Abdominal pain, RLQ 2010  . Gastroparesis JUN 2015 SMART PILL   NO SYMPTOMS  . Solitary rectal ulcer SYNDROME JAN 2013/JUN 2015   TCS-Bx: PROLAPSE/ISCHEMIC CHANGES    Current Outpatient Medications:  .  cetirizine (ZYRTEC) 10 MG tablet, Take 1 tablet (10 mg total) by mouth daily., Disp: 30 tablet, Rfl: 11 .  clobetasol cream (TEMOVATE) 0.05 %, Apply 1 application topically 2 (two) times daily., Disp: 60 g, Rfl: 0 .  famotidine (PEPCID) 20 MG tablet, Take 1 tablet (20 mg total) by mouth 2 (two)  times daily., Disp: 60 tablet, Rfl: 5 .  loratadine (CLARITIN) 10 MG tablet, Take 1 tablet (10 mg total) by mouth daily., Disp: 30 tablet, Rfl: 11 .  predniSONE (STERAPRED UNI-PAK 48 TAB) 10 MG (48) TBPK tablet, Take as directed for 12 days, Disp: 48 tablet, Rfl: 0 .  sertraline (ZOLOFT) 50 MG tablet, Take 1 tablet (50 mg total) by mouth daily., Disp: 30 tablet, Rfl: 5  Assessment/ Plan: 28 y.o. female   1. Urticaria - loratadine (CLARITIN) 10 MG tablet; Take 1 tablet (10 mg total) by mouth daily.  Dispense: 30 tablet; Refill: 11 - cetirizine (ZYRTEC) 10 MG tablet; Take 1 tablet (10 mg total) by mouth daily.  Dispense: 30 tablet; Refill: 11 - famotidine (PEPCID) 20 MG tablet; Take 1 tablet (20 mg total) by mouth 2 (two) times daily.  Dispense: 60 tablet; Refill: 5 - predniSONE (STERAPRED UNI-PAK 48 TAB) 10 MG (48) TBPK tablet; Take as directed for 12 days  Dispense: 48 tablet; Refill: 0   Return if symptoms worsen or fail to improve.  Continue all other maintenance medications as listed above.  Start time: 11:09 AM End time: 11:20 AM  Meds ordered this encounter  Medications  . loratadine (CLARITIN) 10 MG tablet    Sig: Take 1 tablet (10 mg total) by mouth daily.    Dispense:  30 tablet  Refill:  11    Order Specific Question:   Supervising Provider    Answer:   Janora Norlander [[id number]]  . cetirizine (ZYRTEC) 10 MG tablet    Sig: Take 1 tablet (10 mg total) by mouth daily.    Dispense:  30 tablet    Refill:  11    Order Specific Question:   Supervising Provider    Answer:   Janora Norlander [[id number]]  . famotidine (PEPCID) 20 MG tablet    Sig: Take 1 tablet (20 mg total) by mouth 2 (two) times daily.    Dispense:  60 tablet    Refill:  5    Order Specific Question:   Supervising Provider    Answer:   Janora Norlander [[id number]]  . predniSONE (STERAPRED UNI-PAK 48 TAB) 10 MG (48) TBPK tablet    Sig: Take as directed for 12 days    Dispense:  48 tablet     Refill:  0    Order Specific Question:   Supervising Provider    Answer:   Janora Norlander [[id number]]    Particia Nearing PA-C [facility] [phone]

## 2019-07-07 ENCOUNTER — Telehealth: Payer: Self-pay | Admitting: Physician Assistant

## 2019-07-07 NOTE — Telephone Encounter (Signed)
Patient c/o of rash raised bumps all over body that itches. Angel rx prednisone which she has finished. She also states that she was given Claritin,zyrtec and pepcid all for the rash and is no better. Patient uses the drug store in [place]

## 2019-07-08 NOTE — Telephone Encounter (Signed)
Nothing else we can do here will need to go to urgent or ER- patient aware

## 2019-07-23 ENCOUNTER — Ambulatory Visit (INDEPENDENT_AMBULATORY_CARE_PROVIDER_SITE_OTHER): Payer: 59 | Admitting: Physician Assistant

## 2019-07-23 DIAGNOSIS — F411 Generalized anxiety disorder: Secondary | ICD-10-CM

## 2019-07-23 DIAGNOSIS — Z62810 Personal history of physical and sexual abuse in childhood: Secondary | ICD-10-CM | POA: Diagnosis not present

## 2019-07-23 MED ORDER — ESCITALOPRAM OXALATE 10 MG PO TABS: 10.0000 mg | ORAL_TABLET | Freq: Every day | ORAL | 5 refills | Status: DC

## 2019-07-25 ENCOUNTER — Encounter: Payer: Self-pay | Admitting: Physician Assistant
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
## 2020-01-16 IMAGING — US US RENAL
1 series · 15 of 25 positions shown · non-contrast
Comparison: None.

CLINICAL DATA: Left flank pain, 36 weeks pregnant

EXAM:
RENAL / URINARY TRACT ULTRASOUND COMPLETE

[Series 1: us renal · 15 of 29 slices shown]
[im 1/29]
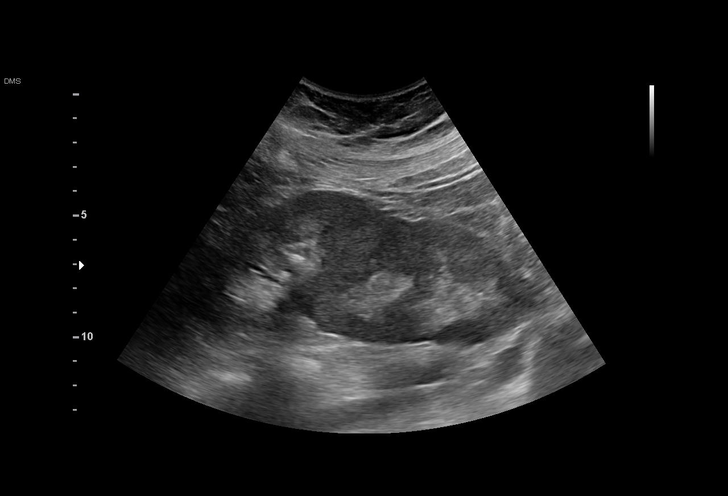
[im 3/29]
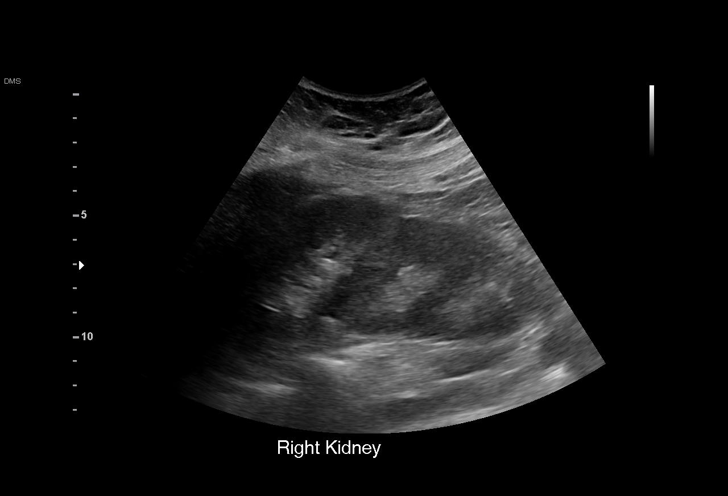
[im 5/29]
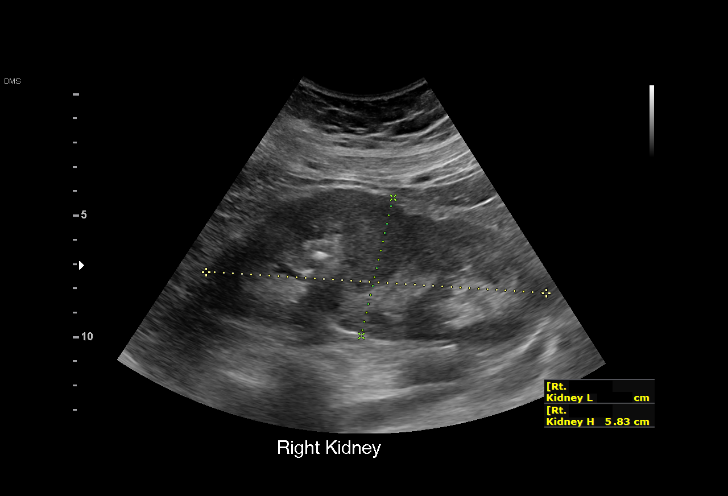
[im 6/29]
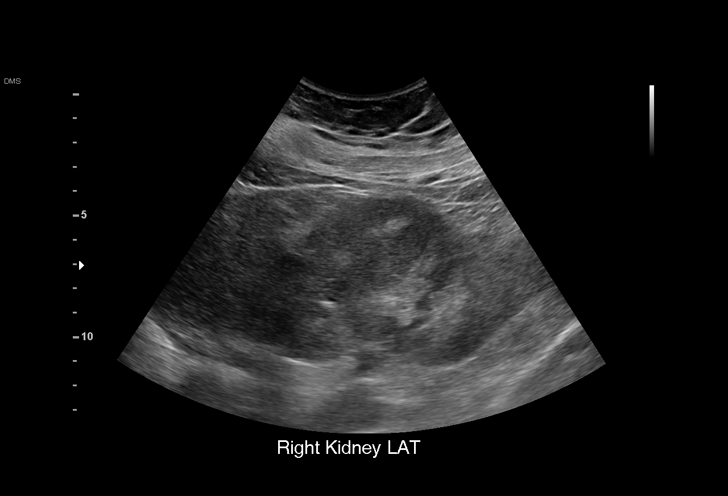
[im 9/29]
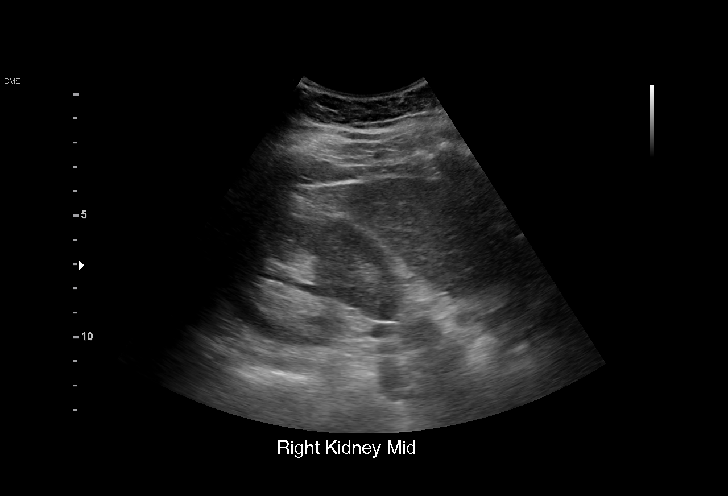
[im 11/29]
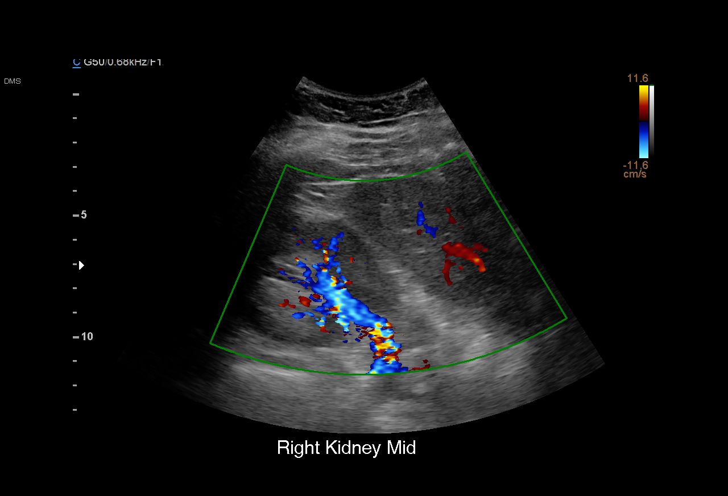
[im 12/29]
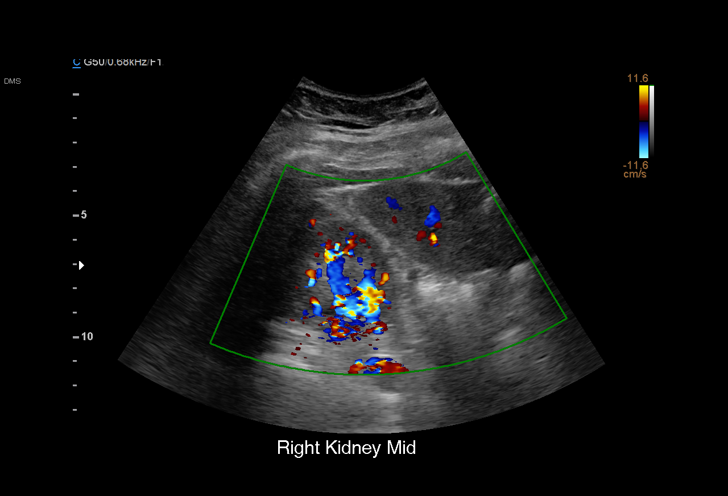
[im 15/29]
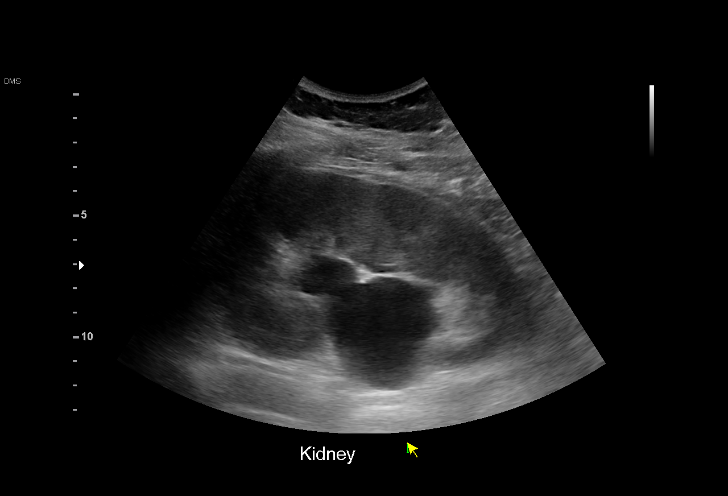
[im 17/29]
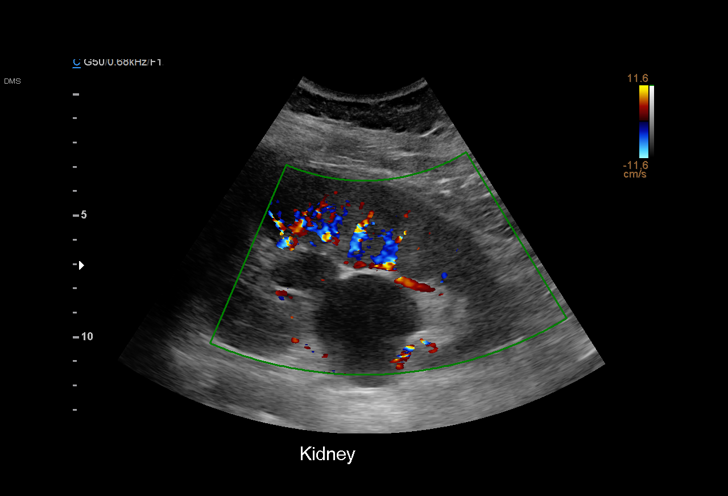
[im 18/29]
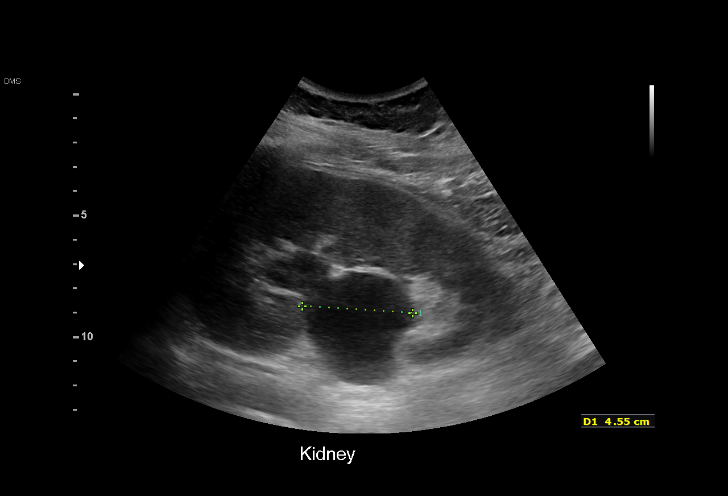
[im 20/29]
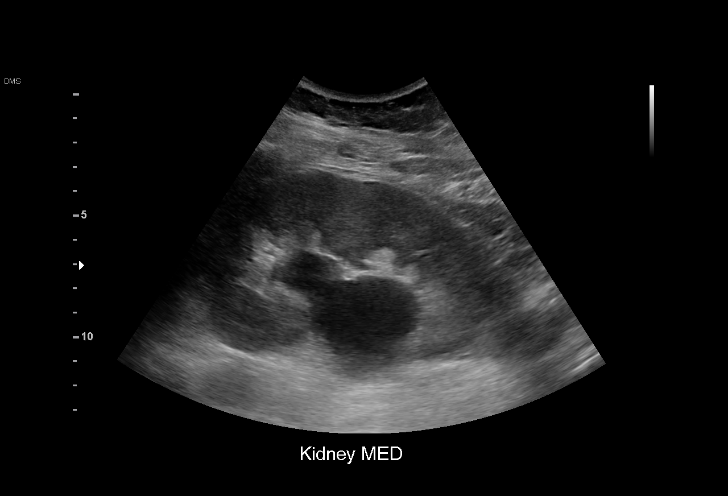
[im 23/29]
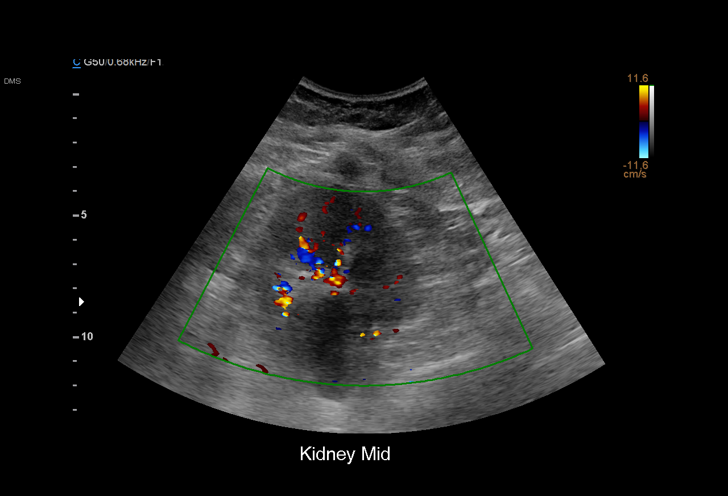
[im 24/29]
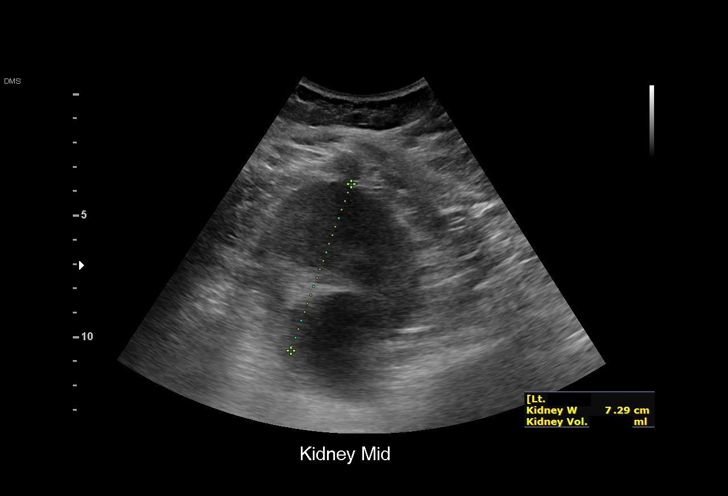
[im 26/29]
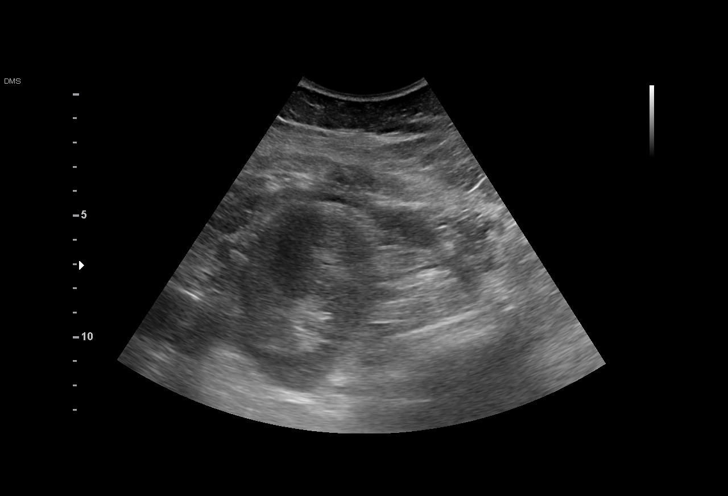
[im 29/29]
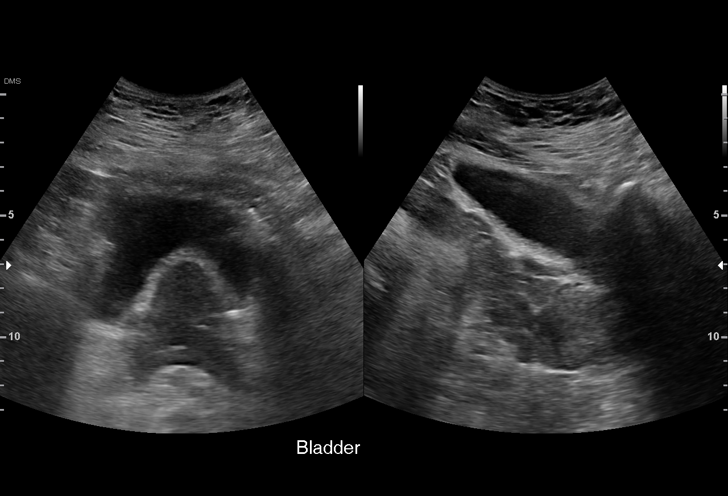

[15 of 25 positions shown; findings below may reference images not displayed]

FINDINGS: Right Kidney:

Renal measurements: 14.0 x 5.8 x 7.0 cm = volume: 298 mL .
Echogenicity within normal limits. No mass or hydronephrosis
visualized.

Left Kidney:

Renal measurements: 14.3 x 8.3 x 7.3 cm = volume: 455 mL. Normal
parenchymal echogenicity. Moderate left hydronephrosis.

Bladder:

Underdistended.
IMPRESSION: Moderate left hydronephrosis.

## 2020-03-16 ENCOUNTER — Other Ambulatory Visit: Payer: Self-pay | Admitting: *Deleted

## 2020-03-16 DIAGNOSIS — F411 Generalized anxiety disorder: Secondary | ICD-10-CM

## 2020-03-16 MED ORDER — ESCITALOPRAM OXALATE 10 MG PO TABS: 10.0000 mg | ORAL_TABLET | Freq: Every day | ORAL | 0 refills | Status: DC

## 2020-04-15 ENCOUNTER — Other Ambulatory Visit: Payer: Self-pay | Admitting: Family

## 2020-04-15 DIAGNOSIS — F411 Generalized anxiety disorder: Secondary | ICD-10-CM

## 2020-04-15 NOTE — Telephone Encounter (Signed)
[place]. NTBS 30 days given 03/16/20 °

## 2020-05-17 ENCOUNTER — Other Ambulatory Visit: Payer: Self-pay | Admitting: Family

## 2020-05-17 DIAGNOSIS — F411 Generalized anxiety disorder: Secondary | ICD-10-CM

## 2020-05-21 ENCOUNTER — Ambulatory Visit (INDEPENDENT_AMBULATORY_CARE_PROVIDER_SITE_OTHER): Payer: 59 | Admitting: Nurse Practitioner

## 2020-05-21 ENCOUNTER — Encounter: Payer: Self-pay | Admitting: Nurse Practitioner

## 2020-05-21 ENCOUNTER — Other Ambulatory Visit: Payer: Self-pay

## 2020-05-21 VITALS — BP 111/69 | HR 112 | Temp 97.4°F | Ht 65.0 in | Wt 187.0 lb

## 2020-05-21 DIAGNOSIS — F411 Generalized anxiety disorder: Secondary | ICD-10-CM

## 2020-05-21 MED ORDER — ESCITALOPRAM OXALATE 10 MG PO TABS: ORAL_TABLET | ORAL | 2 refills | Status: DC
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
## 2020-05-21 NOTE — Patient Instructions (Addendum)
Patient well managed on current medication dose no changes necessary.  Refill sent to pharmacy follow-up with worsening or unresolved symptoms.  Generalized Anxiety Disorder, Adult Generalized anxiety disorder (GAD) is a mental health disorder. People with this condition constantly worry about everyday events. Unlike normal anxiety, worry related to GAD is not triggered by a specific event. These worries also do not fade or get better with time. GAD interferes with life functions, including relationships, work, and school. GAD can vary from mild to severe. People with severe GAD can have intense waves of anxiety with physical symptoms (panic attacks). What are the causes? The exact cause of GAD is not known. What increases the risk? This condition is more likely to develop in:  Women.  People who have a family history of anxiety disorders.  People who are very shy.  People who experience very stressful life events, such as the death of a loved one.  People who have a very stressful family environment. What are the signs or symptoms? People with GAD often worry excessively about many things in their lives, such as their health and family. They may also be overly concerned about:  Doing well at work.  Being on time.  Natural disasters.  Friendships. Physical symptoms of GAD include:  Fatigue.  Muscle tension or having muscle twitches.  Trembling or feeling shaky.  Being easily startled.  Feeling like your heart is pounding or racing.  Feeling out of breath or like you cannot take a deep breath.  Having trouble falling asleep or staying asleep.  Sweating.  Nausea, diarrhea, or irritable bowel syndrome (IBS).  Headaches.  Trouble concentrating or remembering facts.  Restlessness.  Irritability. How is this diagnosed? Your health care provider can diagnose GAD based on your symptoms and medical history. You will also have a physical exam. The health care provider  will ask specific questions about your symptoms, including how severe they are, when they started, and if they come and go. Your health care provider may ask you about your use of alcohol or drugs, including prescription medicines. Your health care provider may refer you to a mental health specialist for further evaluation. Your health care provider will do a thorough examination and may perform additional tests to rule out other possible causes of your symptoms. To be diagnosed with GAD, a person must have anxiety that:  Is out of his or her control.  Affects several different aspects of his or her life, such as work and relationships.  Causes distress that makes him or her unable to take part in normal activities.  Includes at least three physical symptoms of GAD, such as restlessness, fatigue, trouble concentrating, irritability, muscle tension, or sleep problems. Before your health care provider can confirm a diagnosis of GAD, these symptoms must be present more days than they are not, and they must last for six months or longer. How is this treated? The following therapies are usually used to treat GAD:  Medicine. Antidepressant medicine is usually prescribed for long-term daily control. Antianxiety medicines may be added in severe cases, especially when panic attacks occur.  Talk therapy (psychotherapy). Certain types of talk therapy can be helpful in treating GAD by providing support, education, and guidance. Options include: ? Cognitive behavioral therapy (CBT). People learn coping skills and techniques to ease their anxiety. They learn to identify unrealistic or negative thoughts and behaviors and to replace them with positive ones. ? Acceptance and commitment therapy (ACT). This treatment teaches people how to be  mindful as a way to cope with unwanted thoughts and feelings. ? Biofeedback. This process trains you to manage your body's response (physiological response) through breathing  techniques and relaxation methods. You will work with a therapist while machines are used to monitor your physical symptoms.  Stress management techniques. These include yoga, meditation, and exercise. A mental health specialist can help determine which treatment is best for you. Some people see improvement with one type of therapy. However, other people require a combination of therapies. Follow these instructions at home:  Take over-the-counter and prescription medicines only as told by your health care provider.  Try to maintain a normal routine.  Try to anticipate stressful situations and allow extra time to manage them.  Practice any stress management or self-calming techniques as taught by your health care provider.  Do not punish yourself for setbacks or for not making progress.  Try to recognize your accomplishments, even if they are small.  Keep all follow-up visits as told by your health care provider. This is important. Contact a health care provider if:  Your symptoms do not get better.  Your symptoms get worse.  You have signs of depression, such as: ? A persistently sad, cranky, or irritable mood. ? Loss of enjoyment in activities that used to bring you joy. ? Change in weight or eating. ? Changes in sleeping habits. ? Avoiding friends or family members. ? Loss of energy for normal tasks. ? Feelings of guilt or worthlessness. Get help right away if:  You have serious thoughts about hurting yourself or others. If you ever feel like you may hurt yourself or others, or have thoughts about taking your own life, get help right away. You can go to your nearest emergency department or call:  Your local emergency services (911 in the [place]  A suicide crisis helpline, such as the National Suicide Prevention Lifeline at 651-237-4120. This is open 24 hours a day. Summary  Generalized anxiety disorder (GAD) is a mental health disorder that involves worry that is not  triggered by a specific event.  People with GAD often worry excessively about many things in their lives, such as their health and family.  GAD may cause physical symptoms such as restlessness, trouble concentrating, sleep problems, frequent sweating, nausea, diarrhea, headaches, and trembling or muscle twitching.  A mental health specialist can help determine which treatment is best for you. Some people see improvement with one type of therapy. However, other people require a combination of therapies. This information is not intended to replace advice given to you by your health care provider. Make sure you discuss any questions you have with your health care provider. Document Revised: 06/08/2017 Document Reviewed: 05/16/2016 Elsevier Patient Education  2020 [employer].

## 2020-05-27 ENCOUNTER — Ambulatory Visit: Payer: 59 | Admitting: Nurse Practitioner

## 2020-08-03 ENCOUNTER — Ambulatory Visit: Payer: Self-pay | Admitting: Family

## 2020-08-25 ENCOUNTER — Other Ambulatory Visit: Payer: Self-pay | Admitting: Family

## 2020-08-25 DIAGNOSIS — F411 Generalized anxiety disorder: Secondary | ICD-10-CM

## 2020-09-27 ENCOUNTER — Ambulatory Visit: Payer: 59 | Admitting: Family Medicine

## 2020-09-27 ENCOUNTER — Encounter: Payer: Self-pay | Admitting: Family Medicine

## 2020-09-27 DIAGNOSIS — J029 Acute pharyngitis, unspecified: Secondary | ICD-10-CM | POA: Diagnosis not present

## 2020-09-27 LAB — CULTURE, GROUP A STREP

## 2020-09-27 LAB — RAPID STREP SCREEN (MED CTR MEBANE ONLY): Strep Gp A Ag, IA W/Reflex: NEGATIVE

## 2020-09-27 MED ORDER — AMOXICILLIN 875 MG PO TABS: 875.0000 mg | ORAL_TABLET | Freq: Two times a day (BID) | ORAL | 0 refills | Status: AC

## 2020-09-27 NOTE — Progress Notes (Addendum)
   Virtual Visit via telephone Note Due to COVID-19 pandemic this visit was conducted virtually. This visit type was conducted due to national recommendations for restrictions regarding the COVID-19 Pandemic (e.g. social distancing, sheltering in place) in an effort to limit this patient's exposure and mitigate transmission in our community. All issues noted in this document were discussed and addressed.  A physical exam was not performed with this format.  I connected with Andrea Huynh on 09/27/20 at [phone] by telephone and verified that I am speaking with the correct person using two identifiers. Andrea Huynh is currently located at home and no one is currently with her during the visit. The provider, Gabriel Earing, FNP is located in their office at time of visit.  I discussed the limitations, risks, security and privacy concerns of performing an evaluation and management service by telephone and the availability of in person appointments. I also discussed with the patient that there may be a patient responsible charge related to this service. The patient expressed understanding and agreed to proceed.  CC: sore throat  History and Present Illness:  HPI  Andrea Huynh reports a sore throat x 3 days. She reports that her tonsils look swollen and red. She denies exudate. Her lymph nodes on the left side of her neck feel a little tender. She denies congestion, cough, fever, chills, body aches, nausea, vomiting, diarrhea, headaches. Her husband has similar symptoms. Her symptoms are worse at night and with swallowing. Denies difficulty breathing. She has been taking dayquil and nyquil with little relief.     ROS As per HPI.   Observations/Objective: Alert and oriented x 3. Able to speak in full sentences without difficulty.   Assessment and Plan: Andrea Huynh was seen today for sore throat.  Diagnoses and all orders for this visit:  Sore throat Rapid strep negative today in office. Triage nurse  informed by that patient's tonsils are very enlarged and red. Will treat with antibiotics, Amoxicillin sent in. Tylenol, throat lozenges, chloraseptic spray for pain. Stay well hydrated.  -     Rapid Strep Screen ([facility] ONLY) -     Culture, Group A Strep    Follow Up Instructions: Return to office for new or worsening symptoms, or if symptoms persist.     I discussed the assessment and treatment plan with the patient. The patient was provided an opportunity to ask questions and all were answered. The patient agreed with the plan and demonstrated an understanding of the instructions.   The patient was advised to call back or seek an in-person evaluation if the symptoms worsen or if the condition fails to improve as anticipated.  The above assessment and management plan was discussed with the patient. The patient verbalized understanding of and has agreed to the management plan. Patient is aware to call the clinic if symptoms persist or worsen. Patient is aware when to return to the clinic for a follow-up visit. Patient educated on when it is appropriate to go to the emergency department.   Time call ended:  [phone]  I provided 10 minutes of phone time during this encounter.    Gabriel Earing, FNP

## 2020-09-27 NOTE — Addendum Note (Signed)
Addended by: Gabriel Earing on: 09/27/2020 04:06 PM   Modules accepted: Orders

## 2020-09-30 ENCOUNTER — Encounter: Payer: Self-pay | Admitting: *Deleted

## 2020-10-05 ENCOUNTER — Ambulatory Visit: Payer: Self-pay | Admitting: Family

## 2020-10-13 ENCOUNTER — Encounter: Payer: Self-pay | Admitting: Family

## 2020-10-13 ENCOUNTER — Ambulatory Visit: Payer: 59 | Admitting: Family

## 2020-10-13 ENCOUNTER — Other Ambulatory Visit: Payer: Self-pay

## 2020-10-13 VITALS — BP 105/68 | HR 77 | Temp 98.3°F | Ht 65.0 in | Wt 202.2 lb

## 2020-10-13 DIAGNOSIS — Z0001 Encounter for general adult medical examination with abnormal findings: Secondary | ICD-10-CM

## 2020-10-13 DIAGNOSIS — Z0000 Encounter for general adult medical examination without abnormal findings: Secondary | ICD-10-CM

## 2020-10-13 DIAGNOSIS — F411 Generalized anxiety disorder: Secondary | ICD-10-CM

## 2020-10-13 DIAGNOSIS — Z114 Encounter for screening for human immunodeficiency virus [HIV]: Secondary | ICD-10-CM | POA: Diagnosis not present

## 2020-10-13 DIAGNOSIS — Z1159 Encounter for screening for other viral diseases: Secondary | ICD-10-CM

## 2020-10-13 MED ORDER — ESCITALOPRAM OXALATE 20 MG PO TABS: 20.0000 mg | ORAL_TABLET | Freq: Every day | ORAL | 5 refills | Status: DC

## 2020-10-13 MED ORDER — BUSPIRONE HCL 5 MG PO TABS: 5.0000 mg | ORAL_TABLET | Freq: Two times a day (BID) | ORAL | 2 refills | Status: DC | PRN

## 2020-10-13 NOTE — Patient Instructions (Signed)
[email or web address].NIH.Gov">  Generalized Anxiety Disorder, Adult Generalized anxiety disorder (GAD) is a mental health condition. Unlike normal worries, anxiety related to GAD is not triggered by a specific event. These worries do not fade or get better with time. GAD interferes with relationships, work, and school. GAD symptoms can vary from mild to severe. People with severe GAD can have intense waves of anxiety with physical symptoms that are similar to panic attacks. What are the causes? The exact cause of GAD is not known, but the following are believed to have an impact:  Differences in natural brain chemicals.  Genes passed down from parents to children.  Differences in the way threats are perceived.  Development during childhood.  Personality. What increases the risk? The following factors may make you more likely to develop this condition:  Being female.  Having a family history of anxiety disorders.  Being very shy.  Experiencing very stressful life events, such as the death of a loved one.  Having a very stressful family environment. What are the signs or symptoms? People with GAD often worry excessively about many things in their lives, such as their health and family. Symptoms may also include:  Mental and emotional symptoms: ? Worrying excessively about natural disasters. ? Fear of being late. ? Difficulty concentrating. ? Fears that others are judging your performance.  Physical symptoms: ? Fatigue. ? Headaches, muscle tension, muscle twitches, trembling, or feeling shaky. ? Feeling like your heart is pounding or beating very fast. ? Feeling out of breath or like you cannot take a deep breath. ? Having trouble falling asleep or staying asleep, or experiencing restlessness. ? Sweating. ? Nausea, diarrhea, or irritable bowel syndrome (IBS).  Behavioral symptoms: ? Experiencing erratic moods or irritability. ? Avoidance of new situations. ? Avoidance of  people. ? Extreme difficulty making decisions. How is this diagnosed? This condition is diagnosed based on your symptoms and medical history. You will also have a physical exam. Your health care provider may perform tests to rule out other possible causes of your symptoms. To be diagnosed with GAD, a person must have anxiety that:  Is out of his or her control.  Affects several different aspects of his or her life, such as work and relationships.  Causes distress that makes him or her unable to take part in normal activities.  Includes at least three symptoms of GAD, such as restlessness, fatigue, trouble concentrating, irritability, muscle tension, or sleep problems. Before your health care provider can confirm a diagnosis of GAD, these symptoms must be present more days than they are not, and they must last for 6 months or longer. How is this treated? This condition may be treated with:  Medicine. Antidepressant medicine is usually prescribed for long-term daily control. Anti-anxiety medicines may be added in severe cases, especially when panic attacks occur.  Talk therapy (psychotherapy). Certain types of talk therapy can be helpful in treating GAD by providing support, education, and guidance. Options include: ? Cognitive behavioral therapy (CBT). People learn coping skills and self-calming techniques to ease their physical symptoms. They learn to identify unrealistic thoughts and behaviors and to replace them with more appropriate thoughts and behaviors. ? Acceptance and commitment therapy (ACT). This treatment teaches people how to be mindful as a way to cope with unwanted thoughts and feelings. ? Biofeedback. This process trains you to manage your body's response (physiological response) through breathing techniques and relaxation methods. You will work with a therapist while machines are used to monitor your physical   symptoms.  Stress management techniques. These include yoga,  meditation, and exercise. A mental health specialist can help determine which treatment is best for you. Some people see improvement with one type of therapy. However, other people require a combination of therapies.   Follow these instructions at home: Lifestyle  Maintain a consistent routine and schedule.  Anticipate stressful situations. Create a plan, and allow extra time to work with your plan.  Practice stress management or self-calming techniques that you have learned from your therapist or your health care provider. General instructions  Take over-the-counter and prescription medicines only as told by your health care provider.  Understand that you are likely to have setbacks. Accept this and be kind to yourself as you persist to take better care of yourself.  Recognize and accept your accomplishments, even if you judge them as small.  Keep all follow-up visits as told by your health care provider. This is important. Contact a health care provider if:  Your symptoms do not get better.  Your symptoms get worse.  You have signs of depression, such as: ? A persistently sad or irritable mood. ? Loss of enjoyment in activities that used to bring you joy. ? Change in weight or eating. ? Changes in sleeping habits. ? Avoiding friends or family members. ? Loss of energy for normal tasks. ? Feelings of guilt or worthlessness. Get help right away if:  You have serious thoughts about hurting yourself or others. If you ever feel like you may hurt yourself or others, or have thoughts about taking your own life, get help right away. Go to your nearest emergency department or:  Call your local emergency services (911 in the [place]  Call a suicide crisis helpline, such as the National Suicide Prevention Lifeline at 1-800-273-8255. This is open 24 hours a day in the [place]  Text the Crisis Text Line at 741741 (in the [place] Summary  Generalized anxiety disorder (GAD) is a mental  health condition that involves worry that is not triggered by a specific event.  People with GAD often worry excessively about many things in their lives, such as their health and family.  GAD may cause symptoms such as restlessness, trouble concentrating, sleep problems, frequent sweating, nausea, diarrhea, headaches, and trembling or muscle twitching.  A mental health specialist can help determine which treatment is best for you. Some people see improvement with one type of therapy. However, other people require a combination of therapies. This information is not intended to replace advice given to you by your health care provider. Make sure you discuss any questions you have with your health care provider. Document Revised: 04/16/2019 Document Reviewed: 04/16/2019 Elsevier Patient Education  2021 Elsevier Inc.  

## 2020-10-13 NOTE — Progress Notes (Signed)
Subjective:    Patient ID: Andrea Huynh, female    DOB: [birth year], 30 y.o.   MRN: [id number]  Chief Complaint  Patient presents with  . Medical Management of Chronic Issues   Pt presents to the office today for CPE without pap. She is followed by GYN annually.  Anxiety Presents for follow-up visit. Symptoms include depressed mood, excessive worry and nervous/anxious behavior. Patient reports no irritability. Symptoms occur most days. The severity of symptoms is moderate.        Review of Systems  Constitutional: Negative for irritability.  Psychiatric/Behavioral: The patient is nervous/anxious.   All other systems reviewed and are negative.  Family History  Problem Relation Age of Onset  . Constipation Other   . Hypertension Mother   . Colon cancer Neg Hx   . Colon polyps Neg Hx   . Stomach cancer Neg Hx    Social History   Socioeconomic History  . Marital status: Married    Spouse name: Not on file  . Number of children: Not on file  . Years of education: Not on file  . Highest education level: Not on file  Occupational History  . Not on file  Tobacco Use  . Smoking status: Never Smoker  . Smokeless tobacco: Never Used  Substance and Sexual Activity  . Alcohol use: No  . Drug use: No  . Sexual activity: Yes    Birth control/protection: None, I.U.D.  Other Topics Concern  . Not on file  Social History Narrative   STILL WORKING WITH [employer]. HAS A BOYFRIEND.   Social Determinants of Health   Financial Resource Strain: Not on file  Food Insecurity: Not on file  Transportation Needs: Not on file  Physical Activity: Not on file  Stress: Not on file  Social Connections: Not on file       Objective:   Physical Exam Vitals reviewed.  Constitutional:      General: She is not in acute distress.    Appearance: She is well-developed.  HENT:     Head: Normocephalic and atraumatic.     Right Ear: Tympanic membrane normal.     Left Ear: Tympanic membrane  normal.  Eyes:     Pupils: Pupils are equal, round, and reactive to light.  Neck:     Thyroid: No thyromegaly.  Cardiovascular:     Rate and Rhythm: Normal rate and regular rhythm.     Heart sounds: Normal heart sounds. No murmur heard.   Pulmonary:     Effort: Pulmonary effort is normal. No respiratory distress.     Breath sounds: Normal breath sounds. No wheezing.  Abdominal:     General: Bowel sounds are normal. There is no distension.     Palpations: Abdomen is soft.     Tenderness: There is no abdominal tenderness.  Musculoskeletal:        General: No tenderness. Normal range of motion.     Cervical back: Normal range of motion and neck supple.  Skin:    General: Skin is warm and dry.  Neurological:     Mental Status: She is alert and oriented to person, place, and time.     Cranial Nerves: No cranial nerve deficit.     Deep Tendon Reflexes: Reflexes are normal and symmetric.  Psychiatric:        Behavior: Behavior normal.        Thought Content: Thought content normal.        Judgment: Judgment normal.  BP 105/68   Pulse 77   Temp 98.3 F (36.8 C) (Temporal)   Ht '5\' 5"'  (1.651 m)   Wt 202 lb 3.2 oz (91.7 kg)   BMI 33.65 kg/m      Assessment & Plan:  Andrea Huynh comes in today with chief complaint of Medical Management of Chronic Issues   Diagnosis and orders addressed:  1. GAD (generalized anxiety disorder) -Will increase Lexapro to 20 mg from 10 mg Added Buspar 5 mg as needed Stress management  RTO in 4-6 weeks  - escitalopram (LEXAPRO) 20 MG tablet; Take 1 tablet (20 mg total) by mouth daily.  Dispense: 30 tablet; Refill: 5 - busPIRone (BUSPAR) 5 MG tablet; Take 1 tablet (5 mg total) by mouth 2 (two) times daily as needed.  Dispense: 60 tablet; Refill: 2 - CMP14+EGFR - CBC with Differential/Platelet  2. Annual physical exam - HIV Antibody (routine testing w rflx) - Hepatitis C antibody - CMP14+EGFR - CBC with Differential/Platelet -  Lipid panel - TSH  3. Encounter for screening for HIV - HIV Antibody (routine testing w rflx) - CMP14+EGFR - CBC with Differential/Platelet  4. Need for hepatitis C screening test - Hepatitis C antibody - CMP14+EGFR - CBC with Differential/Platelet   Labs pending Health Maintenance reviewed Diet and exercise encouraged  Follow up plan: 4-6 weeks to recheck GAD  Evelina Dun, FNP

## 2020-10-14 LAB — CMP14+EGFR
ALT: 16 IU/L (ref 0–32)
AST: 21 IU/L (ref 0–40)
Albumin/Globulin Ratio: 1.5 (ref 1.2–2.2)
Albumin: 4.2 g/dL (ref 3.9–5.0)
Alkaline Phosphatase: 84 IU/L (ref 44–121)
BUN/Creatinine Ratio: 18 (ref 9–23)
BUN: 10 mg/dL (ref 6–20)
Bilirubin Total: <0.2 mg/dL (ref 0.0–1.2)
CO2: 20 mmol/L (ref 20–29)
Calcium: 9.2 mg/dL (ref 8.7–10.2)
Chloride: 100 mmol/L (ref 96–106)
Creatinine, Ser: 0.56 mg/dL — ABNORMAL LOW (ref 0.57–1.00)
Globulin, Total: 2.8 g/dL (ref 1.5–4.5)
Glucose: 80 mg/dL (ref 65–99)
Potassium: 4.3 mmol/L (ref 3.5–5.2)
Sodium: 136 mmol/L (ref 134–144)
Total Protein: 7 g/dL (ref 6.0–8.5)
eGFR: 126 mL/min/{1.73_m2} (ref >59)

## 2020-10-14 LAB — LIPID PANEL
Chol/HDL Ratio: 5.4 ratio — ABNORMAL HIGH (ref 0.0–4.4)
Cholesterol, Total: 216 mg/dL — ABNORMAL HIGH (ref 100–199)
HDL: 40 mg/dL (ref >39)
LDL Chol Calc (NIH): 131 mg/dL — ABNORMAL HIGH (ref 0–99)
Triglycerides: 252 mg/dL — ABNORMAL HIGH (ref 0–149)
VLDL Cholesterol Cal: 45 mg/dL — ABNORMAL HIGH (ref 5–40)

## 2020-10-14 LAB — CBC WITH DIFFERENTIAL/PLATELET
Basophils Absolute: 0.1 10*3/uL (ref 0.0–0.2)
Basos: 1 %
EOS (ABSOLUTE): 0.2 10*3/uL (ref 0.0–0.4)
Eos: 3 %
Hematocrit: 36.4 % (ref 34.0–46.6)
Hemoglobin: 12.5 g/dL (ref 11.1–15.9)
Immature Grans (Abs): 0 10*3/uL (ref 0.0–0.1)
Immature Granulocytes: 0 %
Lymphocytes Absolute: 2.1 10*3/uL (ref 0.7–3.1)
Lymphs: 26 %
MCH: 29.9 pg (ref 26.6–33.0)
MCHC: 34.3 g/dL (ref 31.5–35.7)
MCV: 87 fL (ref 79–97)
Monocytes Absolute: 0.5 10*3/uL (ref 0.1–0.9)
Monocytes: 6 %
Neutrophils Absolute: 5.4 10*3/uL (ref 1.4–7.0)
Neutrophils: 64 %
Platelets: 368 10*3/uL (ref 150–450)
RBC: 4.18 x10E6/uL (ref 3.77–5.28)
RDW: 12.9 % (ref 11.7–15.4)
WBC: 8.3 10*3/uL (ref 3.4–10.8)

## 2020-10-14 LAB — HIV ANTIBODY (ROUTINE TESTING W REFLEX): HIV Screen 4th Generation wRfx: NONREACTIVE

## 2020-10-14 LAB — TSH: TSH: 1.25 u[IU]/mL (ref 0.450–4.500)

## 2020-10-14 LAB — HEPATITIS C ANTIBODY: Hep C Virus Ab: <0.1 s/co ratio (ref 0.0–0.9)

## 2020-11-09 ENCOUNTER — Encounter: Payer: Self-pay | Admitting: Family Medicine

## 2020-11-30 ENCOUNTER — Ambulatory Visit: Payer: 59 | Admitting: Family

## 2020-12-15 ENCOUNTER — Emergency Department (HOSPITAL_COMMUNITY): Payer: 59

## 2020-12-15 ENCOUNTER — Emergency Department (HOSPITAL_COMMUNITY)
Admission: EM | Admit: 2020-12-15 | Discharge: 2020-12-15 | Disposition: A | Discharge status: Home / Self Care | Payer: 59 | Attending: Emergency Medicine | Admitting: Emergency Medicine

## 2020-12-15 ENCOUNTER — Encounter (HOSPITAL_COMMUNITY): Payer: Self-pay

## 2020-12-15 DIAGNOSIS — N201 Calculus of ureter: Secondary | ICD-10-CM | POA: Diagnosis not present

## 2020-12-15 DIAGNOSIS — R1011 Right upper quadrant pain: Secondary | ICD-10-CM | POA: Diagnosis present

## 2020-12-15 LAB — URINALYSIS, ROUTINE W REFLEX MICROSCOPIC
Bilirubin Urine: NEGATIVE
Glucose, UA: NEGATIVE mg/dL
Ketones, ur: NEGATIVE mg/dL
Nitrite: NEGATIVE
Protein, ur: 30 mg/dL — AB
RBC / HPF: >50 RBC/hpf — ABNORMAL HIGH (ref 0–5)
Specific Gravity, Urine: 1.02 (ref 1.005–1.030)
pH: 5 (ref 5.0–8.0)

## 2020-12-15 LAB — CBC
HCT: 38.5 % (ref 36.0–46.0)
Hemoglobin: 13.1 g/dL (ref 12.0–15.0)
MCH: 30 pg (ref 26.0–34.0)
MCHC: 34 g/dL (ref 30.0–36.0)
MCV: 88.1 fL (ref 80.0–100.0)
Platelets: 279 10*3/uL (ref 150–400)
RBC: 4.37 MIL/uL (ref 3.87–5.11)
RDW: 12.4 % (ref 11.5–15.5)
WBC: 10 10*3/uL (ref 4.0–10.5)
nRBC: 0 % (ref 0.0–0.2)

## 2020-12-15 LAB — I-STAT BETA HCG BLOOD, ED (MC, WL, AP ONLY): I-stat hCG, quantitative: <5 m[IU]/mL (ref <5)

## 2020-12-15 LAB — COMPREHENSIVE METABOLIC PANEL
ALT: 13 U/L (ref 0–44)
AST: 22 U/L (ref 15–41)
Albumin: 3.9 g/dL (ref 3.5–5.0)
Alkaline Phosphatase: 65 U/L (ref 38–126)
Anion gap: 9 (ref 5–15)
BUN: 11 mg/dL (ref 6–20)
CO2: 20 mmol/L — ABNORMAL LOW (ref 22–32)
Calcium: 9 mg/dL (ref 8.9–10.3)
Chloride: 108 mmol/L (ref 98–111)
Creatinine, Ser: 0.75 mg/dL (ref 0.44–1.00)
GFR, Estimated: >60 mL/min (ref >60)
Glucose, Bld: 123 mg/dL — ABNORMAL HIGH (ref 70–99)
Potassium: 3.9 mmol/L (ref 3.5–5.1)
Sodium: 137 mmol/L (ref 135–145)
Total Bilirubin: 0.2 mg/dL — ABNORMAL LOW (ref 0.3–1.2)
Total Protein: 7.6 g/dL (ref 6.5–8.1)

## 2020-12-15 LAB — LIPASE, BLOOD: Lipase: 27 U/L (ref 11–51)

## 2020-12-15 MED ORDER — TAMSULOSIN HCL 0.4 MG PO CAPS: 0.4000 mg | ORAL_CAPSULE | Freq: Every day | ORAL | 0 refills | Status: DC

## 2020-12-15 MED ORDER — ONDANSETRON 4 MG PO TBDP: 4.0000 mg | ORAL_TABLET | Freq: Three times a day (TID) | ORAL | 0 refills | Status: DC | PRN

## 2020-12-15 MED ORDER — SODIUM CHLORIDE 0.9 % IV BOLUS
1000.0000 mL | Freq: Once | INTRAVENOUS | Status: AC
  Administered 2020-12-15: 1000 mL via INTRAVENOUS

## 2020-12-15 MED ORDER — HYDROMORPHONE HCL 1 MG/ML IJ SOLN
0.5000 mg | Freq: Once | INTRAMUSCULAR | Status: AC
  Administered 2020-12-15: 0.5 mg via INTRAVENOUS
  Filled 2020-12-15: qty 1

## 2020-12-15 MED ORDER — ONDANSETRON HCL 4 MG/2ML IJ SOLN
4.0000 mg | Freq: Once | INTRAMUSCULAR | Status: AC
  Administered 2020-12-15: 4 mg via INTRAVENOUS
  Filled 2020-12-15: qty 2

## 2020-12-15 MED ORDER — KETOROLAC TROMETHAMINE 30 MG/ML IJ SOLN
30.0000 mg | Freq: Once | INTRAMUSCULAR | Status: AC
  Administered 2020-12-15: 30 mg via INTRAVENOUS
  Filled 2020-12-15: qty 1

## 2020-12-15 MED ORDER — OXYCODONE-ACETAMINOPHEN 5-325 MG PO TABS: 1.0000 | ORAL_TABLET | Freq: Four times a day (QID) | ORAL | 0 refills | Status: DC | PRN

## 2020-12-15 MED ORDER — HYDROMORPHONE HCL 1 MG/ML IJ SOLN
0.5000 mg | INTRAMUSCULAR | Status: DC | PRN
  Administered 2020-12-15: 0.5 mg via INTRAVENOUS
  Filled 2020-12-15: qty 1
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
## 2020-12-15 NOTE — Discharge Instructions (Signed)
Follow up with urology, call today to schedule an appointment. Return to the ER for fever, vomiting or pain not controlled with medications. Take Percocet as needed as prescribed for pain not controlled with Motrin. Take Zofran as needed as prescribed for nausea and vomiting. Take Flomax daily.

## 2020-12-15 NOTE — ED Triage Notes (Signed)
Pt c/o mid to RUQ abd pain radiating to R flank starting Sunday and getting progressively worse. Reports n/v and chills

## 2020-12-17 ENCOUNTER — Telehealth: Payer: Self-pay | Admitting: Family

## 2020-12-17 ENCOUNTER — Other Ambulatory Visit: Payer: Self-pay

## 2020-12-17 ENCOUNTER — Encounter: Payer: Self-pay | Admitting: Family

## 2020-12-17 ENCOUNTER — Ambulatory Visit: Payer: 59 | Admitting: Family

## 2020-12-17 VITALS — BP 110/75 | HR 80 | Temp 97.2°F | Ht 65.0 in | Wt 200.6 lb

## 2020-12-17 DIAGNOSIS — N912 Amenorrhea, unspecified: Secondary | ICD-10-CM

## 2020-12-17 DIAGNOSIS — F411 Generalized anxiety disorder: Secondary | ICD-10-CM

## 2020-12-17 DIAGNOSIS — N201 Calculus of ureter: Secondary | ICD-10-CM

## 2020-12-17 MED ORDER — BUSPIRONE HCL 7.5 MG PO TABS: 7.5000 mg | ORAL_TABLET | Freq: Three times a day (TID) | ORAL | 2 refills | Status: DC | PRN

## 2020-12-17 NOTE — Progress Notes (Signed)
Subjective:    Patient ID: Andrea Huynh, female    DOB: 1991/05/19, 30 y.o.   MRN: [id number]  Chief Complaint  Patient presents with   Follow-up    Buspar not working    Amenorrhea    8 days late - upt   Pt presents to the office today to recheck GAD. She reports the Buspar did not help. She stopped this.   She reports she has a kidney stone and went to the ED on 12/15/20. She was started Flomax. She has an appointment with Urologist's on Monday to follow up.   She reports she is 8 days late on her period. She has taken two pregnancy tests at home.  Anxiety Presents for follow-up visit. Symptoms include depressed mood, excessive worry, irritability, nervous/anxious behavior and restlessness.       Review of Systems  Constitutional:  Positive for irritability.  Psychiatric/Behavioral:  The patient is nervous/anxious.   All other systems reviewed and are negative.     Objective:   Physical Exam Vitals reviewed.  Constitutional:      General: She is not in acute distress.    Appearance: She is well-developed. She is obese.  HENT:     Head: Normocephalic and atraumatic.     Right Ear: Tympanic membrane normal.     Left Ear: Tympanic membrane normal.  Eyes:     Pupils: Pupils are equal, round, and reactive to light.  Neck:     Thyroid: No thyromegaly.  Cardiovascular:     Rate and Rhythm: Normal rate and regular rhythm.     Heart sounds: Normal heart sounds. No murmur heard. Pulmonary:     Effort: Pulmonary effort is normal. No respiratory distress.     Breath sounds: Normal breath sounds. No wheezing.  Abdominal:     General: Bowel sounds are normal. There is no distension.     Palpations: Abdomen is soft.     Tenderness: There is no abdominal tenderness.  Musculoskeletal:        General: No tenderness. Normal range of motion.     Cervical back: Normal range of motion and neck supple.  Skin:    General: Skin is warm and dry.  Neurological:     Mental  Status: She is alert and oriented to person, place, and time.     Cranial Nerves: No cranial nerve deficit.     Deep Tendon Reflexes: Reflexes are normal and symmetric.  Psychiatric:        Behavior: Behavior normal.        Thought Content: Thought content normal.        Judgment: Judgment normal.         BP 110/75   Pulse 80   Temp (!) 97.2 F (36.2 C) (Temporal)   Ht 5\' 5"  (1.651 m)   Wt 200 lb 9.6 oz (91 kg)   LMP 11/12/2020   BMI 33.38 kg/m   Assessment & Plan:  [name] comes in today with chief complaint of Follow-up (Buspar not working ) and Amenorrhea (8 days late - upt)   Diagnosis and orders addressed:  1. GAD (generalized anxiety disorder) Will increase Buspar to 7.5 mg  Stress management  RTO in 2 weeks  - busPIRone (BUSPAR) 7.5 MG tablet; Take 1 tablet (7.5 mg total) by mouth 3 (three) times daily as needed.  Dispense: 90 tablet; Refill: 2  2. Ureterolithiasis Force fluids  Continue flomax   3. Amenorrhea  Andrea Dun, FNP

## 2020-12-17 NOTE — Telephone Encounter (Signed)
Spoke with patient, appointment scheduled 12/30/20 at 11:55 am.

## 2020-12-17 NOTE — Patient Instructions (Signed)

## 2020-12-21 ENCOUNTER — Other Ambulatory Visit: Payer: Self-pay | Admitting: Urology

## 2020-12-21 DIAGNOSIS — N201 Calculus of ureter: Secondary | ICD-10-CM

## 2020-12-22 NOTE — Progress Notes (Signed)
Left message for patient to call back for ESWL instructions. 

## 2020-12-23 NOTE — Progress Notes (Signed)
Pre op phone call completed.  NPO after midnight.  Pt is aware of instructions.  Arrive at 1030.

## 2020-12-27 ENCOUNTER — Ambulatory Visit (HOSPITAL_COMMUNITY): Payer: 59

## 2020-12-27 ENCOUNTER — Encounter (HOSPITAL_BASED_OUTPATIENT_CLINIC_OR_DEPARTMENT_OTHER)
Admission: RE | Disposition: A | Discharge status: Home / Self Care | Payer: Self-pay | Source: Home / Self Care | Attending: Urology

## 2020-12-27 ENCOUNTER — Encounter (HOSPITAL_BASED_OUTPATIENT_CLINIC_OR_DEPARTMENT_OTHER): Payer: Self-pay | Admitting: Urology

## 2020-12-27 ENCOUNTER — Ambulatory Visit (HOSPITAL_BASED_OUTPATIENT_CLINIC_OR_DEPARTMENT_OTHER)
Admission: RE | Admit: 2020-12-27 | Discharge: 2020-12-27 | Disposition: A | Discharge status: Home / Self Care | Payer: 59 | Attending: Urology | Admitting: Urology

## 2020-12-27 ENCOUNTER — Other Ambulatory Visit: Payer: Self-pay

## 2020-12-27 DIAGNOSIS — N201 Calculus of ureter: Secondary | ICD-10-CM | POA: Diagnosis not present

## 2020-12-27 DIAGNOSIS — N202 Calculus of kidney with calculus of ureter: Secondary | ICD-10-CM | POA: Diagnosis present

## 2020-12-27 HISTORY — PX: EXTRACORPOREAL SHOCK WAVE LITHOTRIPSY: SHX1557

## 2020-12-27 LAB — POCT PREGNANCY, URINE: Preg Test, Ur: NEGATIVE

## 2020-12-27 SURGERY — LITHOTRIPSY, ESWL
Anesthesia: LOCAL | Laterality: Right

## 2020-12-27 MED ORDER — CIPROFLOXACIN HCL 500 MG PO TABS
500.0000 mg | ORAL_TABLET | ORAL | Status: AC
  Administered 2020-12-27: 500 mg via ORAL

## 2020-12-27 MED ORDER — DIAZEPAM 5 MG PO TABS
10.0000 mg | ORAL_TABLET | ORAL | Status: AC
  Administered 2020-12-27: 10 mg via ORAL

## 2020-12-27 MED ORDER — CIPROFLOXACIN HCL 500 MG PO TABS
ORAL_TABLET | ORAL | Status: AC
  Filled 2020-12-27: qty 1

## 2020-12-27 MED ORDER — DIPHENHYDRAMINE HCL 25 MG PO CAPS
ORAL_CAPSULE | ORAL | Status: AC
  Filled 2020-12-27: qty 1

## 2020-12-27 MED ORDER — DIPHENHYDRAMINE HCL 25 MG PO CAPS
25.0000 mg | ORAL_CAPSULE | ORAL | Status: AC
  Administered 2020-12-27: 25 mg via ORAL

## 2020-12-27 MED ORDER — DIAZEPAM 5 MG PO TABS
ORAL_TABLET | ORAL | Status: AC
  Filled 2020-12-27: qty 2

## 2020-12-27 MED ORDER — SODIUM CHLORIDE 0.9 % IV SOLN: INTRAVENOUS | Status: DC

## 2020-12-27 NOTE — Discharge Instructions (Signed)

## 2020-12-27 NOTE — H&P (Signed)
See scanned H&P

## 2020-12-27 NOTE — Op Note (Signed)
See [facility] OP note scanned into chart. Also because of the size, density, location and other factors that cannot be anticipated I feel this will likely be a staged procedure. This fact supersedes any indication in the scanned [facility] operative note to the contrary.  

## 2020-12-29 ENCOUNTER — Encounter (HOSPITAL_BASED_OUTPATIENT_CLINIC_OR_DEPARTMENT_OTHER): Payer: Self-pay | Admitting: Urology

## 2020-12-30 ENCOUNTER — Encounter: Payer: Self-pay | Admitting: Family

## 2020-12-30 ENCOUNTER — Other Ambulatory Visit: Payer: Self-pay

## 2020-12-30 ENCOUNTER — Ambulatory Visit: Payer: 59 | Admitting: Family

## 2020-12-30 VITALS — BP 105/78 | HR 72 | Temp 98.6°F | Ht 65.0 in | Wt 200.4 lb

## 2020-12-30 DIAGNOSIS — F411 Generalized anxiety disorder: Secondary | ICD-10-CM | POA: Diagnosis not present

## 2020-12-30 NOTE — Progress Notes (Signed)
   Subjective:    Patient ID: Andrea Huynh, female    DOB: [birth year], 30 y.o.   MRN: [id number]  Chief Complaint  Patient presents with   Anxiety   Pt presents to the office today to recheck GAD. She was seen on 12/17/20 and we increased her Buspar to 7.5 mg TID prn from 5 mg TID prn. She reports this has greatly helped.   She had lithotripsy on 12/27/20 and states her flank pain has improved. Has follow up with Urologists on 01/11/21.  Anxiety Presents for follow-up visit. Symptoms include excessive worry, irritability, nervous/anxious behavior and restlessness. Symptoms occur occasionally. The severity of symptoms is moderate.       Review of Systems  Constitutional:  Positive for irritability.  Psychiatric/Behavioral:  The patient is nervous/anxious.   All other systems reviewed and are negative.     Objective:   Physical Exam Vitals reviewed.  Constitutional:      General: She is not in acute distress.    Appearance: She is well-developed.  HENT:     Head: Normocephalic and atraumatic.     Right Ear: Tympanic membrane normal.     Left Ear: Tympanic membrane normal.  Eyes:     Pupils: Pupils are equal, round, and reactive to light.  Neck:     Thyroid: No thyromegaly.  Cardiovascular:     Rate and Rhythm: Normal rate and regular rhythm.     Heart sounds: Normal heart sounds. No murmur heard. Pulmonary:     Effort: Pulmonary effort is normal. No respiratory distress.     Breath sounds: Normal breath sounds. No wheezing.  Abdominal:     General: Bowel sounds are normal. There is no distension.     Palpations: Abdomen is soft.     Tenderness: There is no abdominal tenderness.  Musculoskeletal:        General: No tenderness. Normal range of motion.     Cervical back: Normal range of motion and neck supple.  Skin:    General: Skin is warm and dry.  Neurological:     Mental Status: She is alert and oriented to person, place, and time.     Cranial Nerves: No  cranial nerve deficit.     Deep Tendon Reflexes: Reflexes are normal and symmetric.  Psychiatric:        Behavior: Behavior normal.        Thought Content: Thought content normal.        Judgment: Judgment normal.         BP 105/78   Pulse 72   Temp 98.6 F (37 C) (Oral)   Ht 5\' 5"  (1.651 m)   Wt 200 lb 6 oz (90.9 kg)   LMP 12/20/2020   BMI 33.34 kg/m   Assessment & Plan:  Andrea Huynh comes in today with chief complaint of Anxiety   Diagnosis and orders addressed:  1. GAD (generalized anxiety disorder) Continue Buspar  Stress management  Encourage coping techniques  RTO in 6 months   Andrea Dienes, FNP

## 2020-12-30 NOTE — Patient Instructions (Signed)

## 2021-03-23 ENCOUNTER — Other Ambulatory Visit: Payer: Self-pay | Admitting: Family

## 2021-03-23 DIAGNOSIS — F411 Generalized anxiety disorder: Secondary | ICD-10-CM

## 2021-04-29 ENCOUNTER — Ambulatory Visit: Payer: 59 | Admitting: Family

## 2021-04-29 ENCOUNTER — Encounter: Payer: Self-pay | Admitting: Family

## 2021-04-29 DIAGNOSIS — F411 Generalized anxiety disorder: Secondary | ICD-10-CM

## 2021-04-29 DIAGNOSIS — F321 Major depressive disorder, single episode, moderate: Secondary | ICD-10-CM

## 2021-04-29 MED ORDER — FLUOXETINE HCL 40 MG PO CAPS: 40.0000 mg | ORAL_CAPSULE | Freq: Every day | ORAL | 1 refills | Status: DC

## 2021-04-29 NOTE — Progress Notes (Signed)
Virtual Visit  Note Due to COVID-19 pandemic this visit was conducted virtually. This visit type was conducted due to national recommendations for restrictions regarding the COVID-19 Pandemic (e.g. social distancing, sheltering in place) in an effort to limit this patient's exposure and mitigate transmission in our community. All issues noted in this document were discussed and addressed.  A physical exam was not performed with this format.  I connected with Andrea Huynh on 04/29/21 at 9:24 AM  by telephone and verified that I am speaking with the correct person using two identifiers. Andrea Huynh is currently located at school and no one is currently with her during visit. The provider, Jannifer Rodney, FNP is located in their office at time of visit.  I discussed the limitations, risks, security and privacy concerns of performing an evaluation and management service by telephone and the availability of in person appointments. I also discussed with the patient that there may be a patient responsible charge related to this service. The patient expressed understanding and agreed to proceed.  Ms. aviv, rota are scheduled for a virtual visit with your provider today.    Just as we do with appointments in the office, we must obtain your consent to participate.  Your consent will be active for this visit and any virtual visit you may have with one of our providers in the next 365 days.    If you have a MyChart account, I can also send a copy of this consent to you electronically.  All virtual visits are billed to your insurance company just like a traditional visit in the office.  As this is a virtual visit, video technology does not allow for your provider to perform a traditional examination.  This may limit your provider's ability to fully assess your condition.  If your provider identifies any concerns that need to be evaluated in person or the need to arrange testing such as labs, EKG, etc, we  will make arrangements to do so.    Although advances in technology are sophisticated, we cannot ensure that it will always work on either your end or our end.  If the connection with a video visit is poor, we may have to switch to a telephone visit.  With either a video or telephone visit, we are not always able to ensure that we have a secure connection.   I need to obtain your verbal consent now.   Are you willing to proceed with your visit today?   Andrea Huynh has provided verbal consent on 04/29/2021 for a virtual visit (video or telephone).   Jannifer Rodney, [place] 04/29/2021  9:25 AM    History and Present Illness: Pt calls the office today to discuss GAD and Depression. She reports her and her husband are currently going through a divorce. She saw behavorial health who recommended she switch Lexapro 20 mg to Prozac 20 mg.   She was weaning her lexapro to 10 mg and Prozac 20 mg.  Depression        This is a chronic problem.  The current episode started more than 1 year ago.   Associated symptoms include irritable and restlessness.  Associated symptoms include no helplessness, no hopelessness and not sad.  Past treatments include SSRIs - Selective serotonin reuptake inhibitors.  Past medical history includes anxiety.   Anxiety Presents for follow-up visit. Symptoms include excessive worry and restlessness. Patient reports no irritability or nervous/anxious behavior. Symptoms occur most days. The severity of symptoms is  moderate.       Review of Systems  Constitutional:  Negative for irritability.  Psychiatric/Behavioral:  Positive for depression. The patient is not nervous/anxious.     Observations/Objective: No SOB or distress noted   Assessment and Plan: 1. GAD (generalized anxiety disorder) - FLUoxetine (PROZAC) 40 MG capsule; Take 1 capsule (40 mg total) by mouth daily.  Dispense: 90 capsule; Refill: 1  2. Depression, major, single episode, moderate (HCC) - FLUoxetine  (PROZAC) 40 MG capsule; Take 1 capsule (40 mg total) by mouth daily.  Dispense: 90 capsule; Refill: 1  Will increase Prozac to 40 mg from 20 mg Stop Lexapro 10 mg Stress management   I discussed the assessment and treatment plan with the patient. The patient was provided an opportunity to ask questions and all were answered. The patient agreed with the plan and demonstrated an understanding of the instructions.   The patient was advised to call back or seek an in-person evaluation if the symptoms worsen or if the condition fails to improve as anticipated.  The above assessment and management plan was discussed with the patient. The patient verbalized understanding of and has agreed to the management plan. Patient is aware to call the clinic if symptoms persist or worsen. Patient is aware when to return to the clinic for a follow-up visit. Patient educated on when it is appropriate to go to the emergency department.   Time call ended:  9:36 AM   I provided 12 minutes of  non face-to-face time and documenting during this encounter.    Jannifer Rodney, FNP

## 2021-05-19 ENCOUNTER — Other Ambulatory Visit: Payer: Self-pay | Admitting: Family

## 2021-06-07 ENCOUNTER — Ambulatory Visit: Payer: 59 | Admitting: Family

## 2021-07-26 ENCOUNTER — Emergency Department (HOSPITAL_COMMUNITY): Payer: 59

## 2021-07-26 ENCOUNTER — Emergency Department (HOSPITAL_COMMUNITY)
Admission: EM | Admit: 2021-07-26 | Discharge: 2021-07-26 | Disposition: A | Discharge status: Home / Self Care | Payer: 59 | Attending: Emergency Medicine | Admitting: Emergency Medicine

## 2021-07-26 ENCOUNTER — Encounter (HOSPITAL_COMMUNITY): Payer: Self-pay | Admitting: *Deleted

## 2021-07-26 DIAGNOSIS — R109 Unspecified abdominal pain: Secondary | ICD-10-CM | POA: Diagnosis present

## 2021-07-26 DIAGNOSIS — N23 Unspecified renal colic: Secondary | ICD-10-CM

## 2021-07-26 DIAGNOSIS — N201 Calculus of ureter: Secondary | ICD-10-CM | POA: Insufficient documentation

## 2021-07-26 LAB — URINALYSIS, ROUTINE W REFLEX MICROSCOPIC
Bilirubin Urine: NEGATIVE
Glucose, UA: NEGATIVE mg/dL
Ketones, ur: NEGATIVE mg/dL
Leukocytes,Ua: NEGATIVE
Nitrite: NEGATIVE
Specific Gravity, Urine: >1.03 — ABNORMAL HIGH (ref 1.005–1.030)
pH: 5.5 (ref 5.0–8.0)

## 2021-07-26 LAB — PREGNANCY, URINE: Preg Test, Ur: NEGATIVE

## 2021-07-26 LAB — URINALYSIS, MICROSCOPIC (REFLEX)

## 2021-07-26 MED ORDER — IBUPROFEN 400 MG PO TABS: 400.0000 mg | ORAL_TABLET | Freq: Four times a day (QID) | ORAL | 0 refills | Status: DC | PRN

## 2021-07-26 MED ORDER — ONDANSETRON HCL 4 MG PO TABS: 4.0000 mg | ORAL_TABLET | Freq: Four times a day (QID) | ORAL | 0 refills | Status: DC

## 2021-07-26 MED ORDER — TAMSULOSIN HCL 0.4 MG PO CAPS: 0.4000 mg | ORAL_CAPSULE | Freq: Every day | ORAL | 0 refills | Status: AC

## 2021-07-26 MED ORDER — HYDROCODONE-ACETAMINOPHEN 5-325 MG PO TABS: 1.0000 | ORAL_TABLET | Freq: Four times a day (QID) | ORAL | 0 refills | Status: AC | PRN

## 2021-07-26 NOTE — Discharge Instructions (Signed)
Your testing shows that you have a very small kidney stone that is getting ready to pass, you should not have to do anything else for this but I have included some medications for you that may help.  Please return to the emergency department for severe or worsening symptoms.

## 2021-07-26 NOTE — ED Provider Notes (Signed)
[facility] Provider Note   CSN: [id number] Arrival date & time: 07/26/21  1045     History  Chief Complaint  Patient presents with   Flank Pain    Andrea Huynh is a 31 y.o. female.   Flank Pain   This patient is a 31 year old female, she has a history of depression and a prior history of a kidney stone which she suffered with approximately 7 months ago.  She required lithotripsy for the size of the stone but was able to pass the stones afterwards.  She states that this morning she was in her usual state of health when she had recurrent pain in her left flank, this pain was a tightness and a severe discomfort that did cause her to vomit twice on the way to the hospital.  It has since that eased off and is much better at this time.  No fevers, no blood in the urine, no diarrhea.  No medications prehospital  Home Medications Prior to Admission medications   Medication Sig Start Date End Date Taking? Authorizing Provider  HYDROcodone-acetaminophen (NORCO/VICODIN) 5-325 MG tablet Take 1 tablet by mouth every 6 (six) hours as needed for up to 3 days. 07/26/21 07/29/21 Yes Noemi Chapel, MD  ibuprofen (ADVIL) 400 MG tablet Take 1 tablet (400 mg total) by mouth every 6 (six) hours as needed. 07/26/21  Yes Noemi Chapel, MD  ondansetron (ZOFRAN) 4 MG tablet Take 1 tablet (4 mg total) by mouth every 6 (six) hours. 07/26/21  Yes Noemi Chapel, MD  tamsulosin (FLOMAX) 0.4 MG CAPS capsule Take 1 capsule (0.4 mg total) by mouth daily for 5 days. 07/26/21 07/31/21 Yes Noemi Chapel, MD  FLUoxetine (PROZAC) 40 MG capsule Take 1 capsule (40 mg total) by mouth daily. 04/29/21   Sharion Balloon, FNP      Allergies    Patient has no known allergies.    Review of Systems   Review of Systems  Genitourinary:  Positive for flank pain.  All other systems reviewed and are negative.  Physical Exam Updated Vital Signs BP 128/82 (BP Location: Right Arm)    Pulse 89    Temp 98.2 F  (36.8 C) (Oral)    Resp 15    Ht 1.651 m (5\' 5" )    Wt 88.6 kg    SpO2 98%    BMI 32.50 kg/m  Physical Exam Vitals and nursing note reviewed.  Constitutional:      General: She is not in acute distress.    Appearance: She is well-developed.  HENT:     Head: Normocephalic and atraumatic.     Mouth/Throat:     Pharynx: No oropharyngeal exudate.  Eyes:     General: No scleral icterus.       Right eye: No discharge.        Left eye: No discharge.     Conjunctiva/sclera: Conjunctivae normal.     Pupils: Pupils are equal, round, and reactive to light.  Neck:     Thyroid: No thyromegaly.     Vascular: No JVD.  Cardiovascular:     Rate and Rhythm: Normal rate and regular rhythm.     Heart sounds: Normal heart sounds. No murmur heard.   No friction rub. No gallop.  Pulmonary:     Effort: Pulmonary effort is normal. No respiratory distress.     Breath sounds: Normal breath sounds. No wheezing or rales.  Abdominal:     General: Bowel sounds are normal. There  is no distension.     Palpations: Abdomen is soft. There is no mass.     Tenderness: There is no abdominal tenderness.  Musculoskeletal:        General: No tenderness. Normal range of motion.     Cervical back: Normal range of motion and neck supple.  Lymphadenopathy:     Cervical: No cervical adenopathy.  Skin:    General: Skin is warm and dry.     Findings: No erythema or rash.  Neurological:     Mental Status: She is alert.     Coordination: Coordination normal.  Psychiatric:        Behavior: Behavior normal.    ED Results / Procedures / Treatments   Labs (all labs ordered are listed, but only abnormal results are displayed) Labs Reviewed  URINALYSIS, ROUTINE W REFLEX MICROSCOPIC - Abnormal; Notable for the following components:      Result Value   Specific Gravity, Urine >1.030 (*)    Hgb urine dipstick LARGE (*)    Protein, ur TRACE (*)    All other components within normal limits  URINALYSIS, MICROSCOPIC  (REFLEX) - Abnormal; Notable for the following components:   Bacteria, UA FEW (*)    All other components within normal limits  PREGNANCY, URINE    EKG None  Radiology CT Renal Stone Study  Result Date: 07/26/2021 CLINICAL DATA:  Right flank pain since last night denies hematuria. History of renal stones. EXAM: CT ABDOMEN AND PELVIS WITHOUT CONTRAST TECHNIQUE: Multidetector CT imaging of the abdomen and pelvis was performed following the standard protocol without IV contrast. RADIATION DOSE REDUCTION: This exam was performed according to the departmental dose-optimization program which includes automated exposure control, adjustment of the mA and/or kV according to patient size and/or use of iterative reconstruction technique. COMPARISON:  Multiple priors including most recent CT December 15, 2020 FINDINGS: Lower chest: No acute abnormality. Hepatobiliary: Unremarkable noncontrast appearance of the hepatic parenchyma. Gallbladder is unremarkable. No biliary ductal dilation. Pancreas: No pancreatic ductal dilation or evidence of acute inflammation. Spleen: Normal in size without focal abnormality. Adrenals/Urinary Tract: Bilateral adrenal glands appear normal. Mild left hydroureteronephrosis to the level of a 2 mm stone either in the ureterovesicular junction or laying dependently in the urinary bladder. Additional bilateral punctate nonobstructive renal stones measuring up to 2 mm. No right-sided hydronephrosis. Urinary bladder is predominantly decompressed limiting evaluation. Stomach/Bowel: No enteric contrast was administered. Stomach is distended with ingested material without wall thickening. No pathologic dilation of large or small bowel. The appendix and terminal ileum appear normal. No evidence of acute bowel inflammation. Vascular/Lymphatic: No significant vascular findings are present. No enlarged abdominal or pelvic lymph nodes. Reproductive: Uterus and bilateral adnexa are unremarkable. Other: No  abdominal wall hernia or abnormality. No abdominopelvic ascites. Musculoskeletal: No acute or significant osseous findings. IMPRESSION: 1. Left hydroureteronephrosis to the level of a 2 mm stone either in the ureterovesicular junction or laying dependently in the urinary bladder. 2. Additional bilateral punctate nonobstructive renal stones measuring up to 2 mm. Electronically Signed   By: Dahlia Bailiff M.D.   On: 07/26/2021 13:08    Procedures Procedures    Medications Ordered in ED Medications - No data to display  ED Course/ Medical Decision Making/ A&P                           Medical Decision Making This patient is well-appearing at this time, her vital signs are normal  at this time, her history of flank colic is consistent with having ureterolithiasis.  Due to the prior history of a large kidney stone and the need to know how to triage this patient either into the urology office as an outpatient or towards home to pass spontaneously will need a CT scan to evaluate for size.  The patient is agreeable to the plan  Amount and/or Complexity of Data Reviewed External Data Reviewed: radiology.    Details: Prior CT scans were reviewed, shows prior kidney stones Labs: ordered.    Details: I personally viewed and interpreted the labs including hematuria, no signs of infection Radiology: ordered and independent interpretation performed.    Details: I personally viewed and interpreted the CT scan which shows that there does appear to be a small left-sided distal ureteral kidney stone measuring about 2 mm Discussion of management or test interpretation with external provider(s): Patient improved, medications not needed during ER stay, prescriptions given, vital signs normal and stable, patient updated on her results and treatment plan and is in total agreement.  Risk Prescription drug management.          Final Clinical Impression(s) / ED Diagnoses Final diagnoses:  Renal colic on  left side    Rx / DC Orders ED Discharge Orders          Ordered    HYDROcodone-acetaminophen (NORCO/VICODIN) 5-325 MG tablet  Every 6 hours PRN        07/26/21 1314    tamsulosin (FLOMAX) 0.4 MG CAPS capsule  Daily        07/26/21 1314    ondansetron (ZOFRAN) 4 MG tablet  Every 6 hours        07/26/21 1314    ibuprofen (ADVIL) 400 MG tablet  Every 6 hours PRN        07/26/21 1314              Noemi Chapel, MD 07/26/21 1316

## 2021-07-26 NOTE — ED Triage Notes (Signed)
Left flank pain onset today with nausea

## 2021-08-25 ENCOUNTER — Telehealth: Payer: Self-pay | Admitting: Family

## 2021-08-25 MED ORDER — FLUOXETINE HCL 40 MG PO CAPS: 80.0000 mg | ORAL_CAPSULE | Freq: Every day | ORAL | 0 refills | Status: DC

## 2021-08-25 NOTE — Telephone Encounter (Signed)
Patient aware and verbalized understanding. °

## 2021-08-25 NOTE — Telephone Encounter (Signed)
Prozac 80 mg Prescription sent to pharmacy

## 2021-09-06 ENCOUNTER — Telehealth: Payer: Self-pay | Admitting: Family

## 2021-09-06 NOTE — Telephone Encounter (Signed)
Called and advised we will discuss change at appointment

## 2021-09-08 ENCOUNTER — Ambulatory Visit: Payer: Self-pay | Admitting: Family

## 2022-02-23 ENCOUNTER — Other Ambulatory Visit: Payer: Self-pay | Admitting: Family

## 2022-05-05 ENCOUNTER — Other Ambulatory Visit: Payer: Self-pay | Admitting: Family

## 2022-05-05 NOTE — Telephone Encounter (Signed)
Thirty day supply given. Patient must be seen for any further refills.  °

## 2022-05-08 ENCOUNTER — Encounter: Payer: Self-pay | Admitting: Family

## 2022-05-08 NOTE — Telephone Encounter (Signed)
N/A Mailed letter.

## 2022-06-28 ENCOUNTER — Other Ambulatory Visit: Payer: Self-pay | Admitting: Family

## 2022-07-19 ENCOUNTER — Encounter: Payer: Self-pay | Admitting: *Deleted

## 2022-07-23 ENCOUNTER — Emergency Department (HOSPITAL_BASED_OUTPATIENT_CLINIC_OR_DEPARTMENT_OTHER): Payer: BC Managed Care – PPO

## 2022-07-23 ENCOUNTER — Encounter (HOSPITAL_BASED_OUTPATIENT_CLINIC_OR_DEPARTMENT_OTHER): Payer: Self-pay

## 2022-07-23 ENCOUNTER — Emergency Department (HOSPITAL_BASED_OUTPATIENT_CLINIC_OR_DEPARTMENT_OTHER)
Admission: EM | Admit: 2022-07-23 | Discharge: 2022-07-23 | Disposition: A | Discharge status: Home / Self Care | Payer: BC Managed Care – PPO | Attending: Emergency Medicine | Admitting: Emergency Medicine

## 2022-07-23 ENCOUNTER — Other Ambulatory Visit: Payer: Self-pay

## 2022-07-23 DIAGNOSIS — S20312A Abrasion of left front wall of thorax, initial encounter: Secondary | ICD-10-CM | POA: Insufficient documentation

## 2022-07-23 DIAGNOSIS — X58XXXA Exposure to other specified factors, initial encounter: Secondary | ICD-10-CM | POA: Insufficient documentation

## 2022-07-23 DIAGNOSIS — R0789 Other chest pain: Secondary | ICD-10-CM | POA: Diagnosis present

## 2022-07-23 DIAGNOSIS — R079 Chest pain, unspecified: Secondary | ICD-10-CM

## 2022-07-23 LAB — CBC
HCT: 38.4 % (ref 36.0–46.0)
Hemoglobin: 13.3 g/dL (ref 12.0–15.0)
MCH: 30.2 pg (ref 26.0–34.0)
MCHC: 34.6 g/dL (ref 30.0–36.0)
MCV: 87.1 fL (ref 80.0–100.0)
Platelets: 345 10*3/uL (ref 150–400)
RBC: 4.41 MIL/uL (ref 3.87–5.11)
RDW: 12.5 % (ref 11.5–15.5)
WBC: 9.8 10*3/uL (ref 4.0–10.5)
nRBC: 0 % (ref 0.0–0.2)

## 2022-07-23 LAB — BASIC METABOLIC PANEL
Anion gap: 11 (ref 5–15)
BUN: 14 mg/dL (ref 6–20)
CO2: 23 mmol/L (ref 22–32)
Calcium: 9.9 mg/dL (ref 8.9–10.3)
Chloride: 100 mmol/L (ref 98–111)
Creatinine, Ser: 0.63 mg/dL (ref 0.44–1.00)
GFR, Estimated: >60 mL/min (ref >60)
Glucose, Bld: 89 mg/dL (ref 70–99)
Potassium: 3.8 mmol/L (ref 3.5–5.1)
Sodium: 134 mmol/L — ABNORMAL LOW (ref 135–145)

## 2022-07-23 LAB — TROPONIN I (HIGH SENSITIVITY)
Troponin I (High Sensitivity): <2 ng/L (ref <18)
Troponin I (High Sensitivity): <2 ng/L (ref <18)

## 2022-07-23 LAB — HCG, SERUM, QUALITATIVE: Preg, Serum: NEGATIVE

## 2022-07-23 LAB — D-DIMER, QUANTITATIVE: D-Dimer, Quant: 0.52 ug/mL-FEU — ABNORMAL HIGH (ref 0.00–0.50)

## 2022-07-23 MED ORDER — IOHEXOL 350 MG/ML SOLN
100.0000 mL | Freq: Once | INTRAVENOUS | Status: AC | PRN
  Administered 2022-07-23: 80 mL via INTRAVENOUS

## 2022-07-23 MED ORDER — KETOROLAC TROMETHAMINE 15 MG/ML IJ SOLN
15.0000 mg | Freq: Once | INTRAMUSCULAR | Status: AC
  Administered 2022-07-23: 15 mg via INTRAVENOUS
  Filled 2022-07-23: qty 1

## 2022-07-23 MED ORDER — ACETAMINOPHEN 500 MG PO TABS
1000.0000 mg | ORAL_TABLET | Freq: Once | ORAL | Status: AC
  Administered 2022-07-23: 1000 mg via ORAL
  Filled 2022-07-23: qty 2

## 2022-07-23 MED ORDER — FAMOTIDINE 20 MG PO TABS
20.0000 mg | ORAL_TABLET | Freq: Once | ORAL | Status: AC
  Administered 2022-07-23: 20 mg via ORAL
  Filled 2022-07-23: qty 1

## 2022-07-23 MED ORDER — LACTATED RINGERS IV BOLUS
1000.0000 mL | Freq: Once | INTRAVENOUS | Status: AC
  Administered 2022-07-23: 1000 mL via INTRAVENOUS

## 2022-07-23 NOTE — ED Provider Notes (Signed)
[facility] EMERGENCY DEPT Provider Note   CSN: [id number] Arrival date & time: 07/23/22  1827     History Chief Complaint  Patient presents with   Chest Pain    HPI Andrea Huynh is a 32 y.o. female presenting for chief complaint of 12 hours of chest pain.  She states that when she was on her way to church this morning started having left upper chest pain.  Denies any history of similar.  Denies fevers chills nausea vomiting syncope shortness of breath.  She otherwise ambulatory tolerating p.o. intake.  Pain is still persistent.  Denies any estrogen use, leg swelling or pain..   Patient's recorded medical, surgical, social, medication list and allergies were reviewed in the Snapshot window as part of the initial history.   Review of Systems   Review of Systems  Constitutional:  Negative for chills and fever.  HENT:  Negative for ear pain and sore throat.   Eyes:  Negative for pain and visual disturbance.  Respiratory:  Negative for cough and shortness of breath.   Cardiovascular:  Positive for chest pain. Negative for palpitations.  Gastrointestinal:  Negative for abdominal pain and vomiting.  Genitourinary:  Negative for dysuria and hematuria.  Musculoskeletal:  Negative for arthralgias and back pain.  Skin:  Negative for color change and rash.  Neurological:  Negative for seizures and syncope.  All other systems reviewed and are negative.   Physical Exam Updated Vital Signs BP 127/76   Pulse 95   Temp 98.9 F (37.2 C) (Oral)   Resp 20   Ht 5\' 5"  (1.651 m)   Wt 88.6 kg   SpO2 100%   BMI 32.50 kg/m  Physical Exam Vitals and nursing note reviewed.  Constitutional:      General: She is not in acute distress.    Appearance: She is well-developed.  HENT:     Head: Normocephalic and atraumatic.  Eyes:     Conjunctiva/sclera: Conjunctivae normal.  Cardiovascular:     Rate and Rhythm: Normal rate and regular rhythm.     Heart sounds: No murmur  heard. Pulmonary:     Effort: Pulmonary effort is normal. No respiratory distress.     Breath sounds: Normal breath sounds.  Abdominal:     General: There is no distension.     Palpations: Abdomen is soft.     Tenderness: There is no abdominal tenderness. There is no right CVA tenderness or left CVA tenderness.  Musculoskeletal:        General: No swelling or tenderness. Normal range of motion.     Cervical back: Neck supple.  Skin:    General: Skin is warm and dry.  Neurological:     General: No focal deficit present.     Mental Status: She is alert and oriented to person, place, and time. Mental status is at baseline.     Cranial Nerves: No cranial nerve deficit.      ED Course/ Medical Decision Making/ A&P    Procedures Procedures   Medications Ordered in ED Medications  famotidine (PEPCID) tablet 20 mg (20 mg Oral Given 07/23/22 1954)  acetaminophen (TYLENOL) tablet 1,000 mg (1,000 mg Oral Given 07/23/22 1954)  ketorolac (TORADOL) 15 MG/ML injection 15 mg (15 mg Intravenous Given 07/23/22 1954)  lactated ringers bolus 1,000 mL (0 mLs Intravenous Stopped 07/23/22 2118)  iohexol (OMNIPAQUE) 350 MG/ML injection 100 mL (80 mLs Intravenous Contrast Given 07/23/22 2123)   Medical Decision Making: Andrea Huynh is a  32 y.o. female who presented to the ED today with chest pain, detailed above.  Based on patient's comorbidities, patient has a heart score of 2.    Patient's presentation is complicated by their history of elevated BMI .  Patient placed on continuous vitals and telemetry monitoring while in ED which was reviewed periodically.  Complete initial physical exam performed, notably the patient was medically stable no acute distress.   Reviewed and confirmed nursing documentation for past medical history, family history, social history.    Initial Assessment: With the patient's presentation of left-sided chest pain, most likely diagnosis is musculoskeletal chest pain versus  GERD, although ACS remains on the differential. Other diagnoses were considered including (but not limited to) pulmonary embolism, community-acquired pneumonia, aortic dissection, pneumothorax, underlying bony abnormality, anemia. These are considered less likely due to history of present illness and physical exam findings.    In particular, concerning pulmonary embolism: Patient is PERC positive secondary to tachycardia and the they deny malignancy, recent surgery, history of DVT, or calf tenderness leading to a low risk Wells score. Aortic Dissection also reconsidered but seems less likely based on the location, quality, onset, and severity of symptoms in this case. Patient has a lack of serious comorbidities for this condition including a lack of HTN or Smoking. Patient also has a lack of underlying history of AD or TAA.  This is most consistent with an acute life/limb threatening illness complicated by underlying chronic conditions.   Initial Plan: Evaluate for ACS with delta troponin and EKG evaluated as below  Evaluate for dissection, bony abnormality, or pneumonia with chest x-ray and screening laboratory evaluation including CBC, BMP  Further evaluation for pulmonary embolism indicated at this time based on patient's PERC and Wells score.  Will proceed with D-dimer which was ultimately positive.  Will proceed to CT angiography of the chest Further evaluation for Thoracic Aortic Dissection not indicated at this time based on patient's clinical history and PE findings.   Initial Study Results: EKG was reviewed independently. Rate, rhythm, axis, intervals all examined and without medically relevant abnormality. ST segments without concerns for elevations.    Laboratory  Delta  troponin demonstrated no acute abnormalities  CBC and BMP without obvious metabolic or inflammatory abnormalities requiring further evaluation   Radiology  CT Angio Chest Pulmonary Embolism (PE) W or WO  Contrast  Result Date: 07/23/2022 CLINICAL DATA:  Chest pain and positive D-dimer EXAM: CT ANGIOGRAPHY CHEST WITH CONTRAST TECHNIQUE: Multidetector CT imaging of the chest was performed using the standard protocol during bolus administration of intravenous contrast. Multiplanar CT image reconstructions and MIPs were obtained to evaluate the vascular anatomy. RADIATION DOSE REDUCTION: This exam was performed according to the departmental dose-optimization program which includes automated exposure control, adjustment of the mA and/or kV according to patient size and/or use of iterative reconstruction technique. CONTRAST:  22mL OMNIPAQUE IOHEXOL 350 MG/ML SOLN COMPARISON:  Chest x-ray from earlier in the same day. FINDINGS: Cardiovascular: Thoracic aorta and its branches are within normal limits. Heart is at the upper limits of normal in size. The pulmonary artery shows a normal branching pattern bilaterally no definitive pulmonary emboli are seen. Mediastinum/Nodes: Thoracic inlet is within normal limits. No hilar or mediastinal adenopathy is noted. The esophagus as visualized is within normal limits. Lungs/Pleura: Lungs are well aerated bilaterally. No focal infiltrate or effusion is seen. No parenchymal nodules are noted. Upper Abdomen: No acute abnormality. Musculoskeletal: No chest wall abnormality. No acute or significant osseous findings. Review of  the MIP images confirms the above findings. IMPRESSION: No evidence of pulmonary emboli. No acute abnormality noted. Electronically Signed   By: Alcide Clever M.D.   On: 07/23/2022 21:51   DG Chest Port 1 View  Result Date: 07/23/2022 CLINICAL DATA:  Chest pain EXAM: PORTABLE CHEST 1 VIEW COMPARISON:  None Available. FINDINGS: Normal heart size. Normal mediastinal contour. No pneumothorax. No pleural effusion. Lungs appear clear, with no acute consolidative airspace disease and no pulmonary edema. IMPRESSION: No active disease. Electronically Signed   By: Delbert Phenix M.D.   On: 07/23/2022 19:33    Final Assessment and Plan: CT angiography with no focal pathology.  Patient's symptoms resolved after NSAIDs and Pepcid.  Uncertain underlying etiology that remains nonspecific.  Patient to follow-up with PCP within 48 hours.  ACS/pulmonary embolism ruled out based on today's findings, low heart score.  Patient will need strict return precautions and follow-up with PCP.           Clinical Impression:  1. Abrasion of left chest wall, initial encounter   2. Chest pain, unspecified type      Discharge   Final Clinical Impression(s) / ED Diagnoses Final diagnoses:  Abrasion of left chest wall, initial encounter  Chest pain, unspecified type    Rx / DC Orders ED Discharge Orders     None         Glyn Ade, MD 07/23/22 2244

## 2022-07-23 NOTE — ED Triage Notes (Signed)
Patient here POV from Home.  Endorses CP, Mostly Left and Intermittent, that began this PM. Mostly Tight in nature.   No Discernable N/V. No SOB.  NAD Noted during Triage. A&Ox4. GCS 15. Ambulatory.

## 2022-07-23 NOTE — ED Notes (Signed)
Pt to CT scanner at this time via wheelchair  

## 2022-07-24 ENCOUNTER — Telehealth: Payer: Self-pay

## 2022-07-24 NOTE — Telephone Encounter (Signed)
Transition Care Management Follow-up Telephone Call Date of discharge and from where: 07/23/2022 [facility] How have you been since you were released from the hospital? Has not experienced any chest pain Any questions or concerns? No  Items Reviewed: Did the pt receive and understand the discharge instructions provided? Yes  Medications obtained and verified?  No medications given Other?  N/A Any new allergies since your discharge? No  Dietary orders reviewed? Yes Do you have support at home? Yes   Home Care and Equipment/Supplies: Were home health services ordered? not applicable If so, what is the name of the agency? N/A  Has the agency set up a time to come to the patient's home? not applicable Were any new equipment or medical supplies ordered?  No What is the name of the medical supply agency? N/A Were you able to get the supplies/equipment? not applicable Do you have any questions related to the use of the equipment or supplies? No  Functional Questionnaire: (I = Independent and D = Dependent) ADLs: I   Bathing/Dressing- I  Meal Prep- I  Eating- I  Maintaining continence- I  Transferring/Ambulation- I  Managing Meds- I  Follow up appointments reviewed:  PCP Hospital f/u appt confirmed? Yes  Scheduled to see Evelina Dun @ 11:25 am on 07/25/2022. [facility] f/u appt confirmed?  N/A   Are transportation arrangements needed? No  If their condition worsens, is the pt aware to call PCP or go to the Emergency Dept.? Yes Was the patient provided with contact information for the PCP's office or ED? Yes Was to pt encouraged to call back with questions or concerns? Yes

## 2022-07-25 ENCOUNTER — Ambulatory Visit (INDEPENDENT_AMBULATORY_CARE_PROVIDER_SITE_OTHER): Payer: BC Managed Care – PPO | Admitting: Family

## 2022-07-25 ENCOUNTER — Encounter: Payer: Self-pay | Admitting: Family

## 2022-07-25 VITALS — BP 114/82 | HR 85 | Temp 97.7°F | Ht 65.0 in | Wt 200.6 lb

## 2022-07-25 DIAGNOSIS — Z23 Encounter for immunization: Secondary | ICD-10-CM | POA: Diagnosis not present

## 2022-07-25 DIAGNOSIS — E669 Obesity, unspecified: Secondary | ICD-10-CM

## 2022-07-25 DIAGNOSIS — F411 Generalized anxiety disorder: Secondary | ICD-10-CM

## 2022-07-25 DIAGNOSIS — Z09 Encounter for follow-up examination after completed treatment for conditions other than malignant neoplasm: Secondary | ICD-10-CM | POA: Diagnosis not present

## 2022-07-25 DIAGNOSIS — R079 Chest pain, unspecified: Secondary | ICD-10-CM | POA: Diagnosis not present

## 2022-07-25 NOTE — Progress Notes (Signed)
Subjective:    Patient ID: Andrea Huynh, female    DOB: [birth year], 32 y.o.   MRN: [id number]  Chief Complaint  Patient presents with   ER follow    PT presents to the office today for ED follow up. She was having chest pain on 07/23/22 and was negative for PE, CAP, aortic dissection, or pneumothorax. Thought it might be MSK related. Denies any recent injury.   She does have GAD and takes Prozac 40 mg daily. Reports this is stable.  Chest Pain  This is a new problem. The onset quality is sudden. The problem has been resolved. The pain is present in the substernal region. Pertinent negatives include no back pain, claudication, cough, dizziness, exertional chest pressure, fever, irregular heartbeat, nausea, palpitations, shortness of breath, sputum production or vomiting.      Review of Systems  Constitutional:  Negative for fever.  Respiratory:  Negative for cough, sputum production and shortness of breath.   Cardiovascular:  Positive for chest pain. Negative for palpitations and claudication.  Gastrointestinal:  Negative for nausea and vomiting.  Musculoskeletal:  Negative for back pain.  Neurological:  Negative for dizziness.  All other systems reviewed and are negative.      Objective:   Physical Exam Vitals reviewed.  Constitutional:      General: She is not in acute distress.    Appearance: She is well-developed.  HENT:     Head: Normocephalic and atraumatic.     Right Ear: Tympanic membrane normal.     Left Ear: Tympanic membrane normal.  Eyes:     Pupils: Pupils are equal, round, and reactive to light.  Neck:     Thyroid: No thyromegaly.  Cardiovascular:     Rate and Rhythm: Normal rate and regular rhythm.     Heart sounds: Normal heart sounds. No murmur heard. Pulmonary:     Effort: Pulmonary effort is normal. No respiratory distress.     Breath sounds: Normal breath sounds. No wheezing.  Abdominal:     General: Bowel sounds are normal. There is no  distension.     Palpations: Abdomen is soft.     Tenderness: There is no abdominal tenderness.  Musculoskeletal:        General: No tenderness. Normal range of motion.     Cervical back: Normal range of motion and neck supple.  Skin:    General: Skin is warm and dry.  Neurological:     Mental Status: She is alert and oriented to person, place, and time.     Cranial Nerves: No cranial nerve deficit.     Deep Tendon Reflexes: Reflexes are normal and symmetric.  Psychiatric:        Behavior: Behavior normal.        Thought Content: Thought content normal.        Judgment: Judgment normal.    BP 114/82   Pulse 85   Temp 97.7 F (36.5 C) (Temporal)   Ht 5\' 5"  (1.651 m)   Wt 200 lb 9.6 oz (91 kg)   SpO2 99%   BMI 33.38 kg/m       Assessment & Plan:   Andrea Huynh comes in today with chief complaint of ER follow    Diagnosis and orders addressed:  1. GAD (generalized anxiety disorder)  2. Obesity (BMI 30-39.9)  3. Hospital discharge follow-up  4. Chest pain, unspecified type  5. Need for immunization against influenza  - Flu Vaccine QUAD 46mo+IM (Fluarix, Fluzone &  Alfiuria Quad PF)   Hospital notes reviewed Continue current medications  Health Maintenance reviewed Diet and exercise encouraged  Follow up plan: Keep chronic follow up   Evelina Dun, FNP

## 2022-07-25 NOTE — Patient Instructions (Signed)
Nonspecific Chest Pain, Adult Chest pain is an uncomfortable, tight, or painful feeling in the chest. The pain can feel like a crushing, aching, or squeezing pressure. A person can feel a burning or tingling sensation. Chest pain can also be felt in your back, neck, jaw, shoulder, or arm. This pain can be worse when you move, sneeze, or take a deep breath. Chest pain can be caused by a condition that is life-threatening. This must be treated right away. It can also be caused by something that is not life-threatening. If you have chest pain, it can be hard to know the difference, so it is important to get help right away to make sure that you do not have a serious condition. Some life-threatening causes of chest pain include: Heart attack. A tear in the body's main blood vessel (aortic dissection). Inflammation around your heart (pericarditis). A problem in the lungs, such as a blood clot (pulmonary embolism) or a collapsed lung (pneumothorax). Some non life-threatening causes of chest pain include: Heartburn. Anxiety or stress. Damage to the bones, muscles, and cartilage that make up your chest wall. Pneumonia or bronchitis. Shingles infection (varicella-zoster virus). Your chest pain may come and go. It may also be constant. Your health care provider will do tests and other studies to find the cause of your pain. Treatment will depend on the cause of your chest pain. Follow these instructions at home: Medicines Take over-the-counter and prescription medicines only as told by your health care provider. If you were prescribed an antibiotic medicine, take it as told by your health care provider. Do not stop taking the antibiotic even if you start to feel better. Activity Avoid any activities that cause chest pain. Do not lift anything that is heavier than 10 lb (4.5 kg), or the limit that you are told, until your health care provider says that it is safe. Rest as directed by your health care  provider. Return to your normal activities only as told by your health care provider. Ask your health care provider what activities are safe for you. Lifestyle     Do not use any products that contain nicotine or tobacco, such as cigarettes, e-cigarettes, and chewing tobacco. If you need help quitting, ask your health care provider. Do not drink alcohol. Make healthy lifestyle changes as recommended. These may include: Getting regular exercise. Ask your health care provider to suggest some exercises that are safe for you. Eating a heart-healthy diet. This includes plenty of fresh fruits and vegetables, whole grains, low-fat (lean) protein, and low-fat dairy products. A dietitian can help you find healthy eating options. Maintaining a healthy weight. Managing any other health conditions you may have, such as high blood pressure (hypertension) or diabetes. Reducing stress, such as with yoga or relaxation techniques. General instructions Pay attention to any changes in your symptoms. It is up to you to get the results of any tests that were done. Ask your health care provider, or the department that is doing the tests, when your results will be ready. Keep all follow-up visits as told by your health care provider. This is important. You may be asked to go for further testing if your chest pain does not go away. Contact a health care provider if: Your chest pain does not go away. You feel depressed. You have a fever. You notice changes in your symptoms or develop new symptoms. Get help right away if: Your chest pain gets worse. You have a cough that gets worse, or you   cough up blood. You have severe pain in your abdomen. You faint. You have sudden, unexplained chest discomfort. You have sudden, unexplained discomfort in your arms, back, neck, or jaw. You have shortness of breath at any time. You suddenly start to sweat, or your skin gets clammy. You feel nausea or you vomit. You  suddenly feel lightheaded or dizzy. You have severe weakness, or unexplained weakness or fatigue. Your heart begins to beat quickly, or it feels like it is skipping beats. These symptoms may represent a serious problem that is an emergency. Do not wait to see if the symptoms will go away. Get medical help right away. Call your local emergency services (911 in the [place] Do not drive yourself to the hospital. Summary Chest pain can be caused by a condition that is serious and requires urgent treatment. It may also be caused by something that is not life-threatening. Your health care provider may do lab tests and other studies to find the cause of your pain. Follow your health care provider's instructions on taking medicines, making lifestyle changes, and getting emergency treatment if symptoms become worse. Keep all follow-up visits as told by your health care provider. This includes visits for any further testing if your chest pain does not go away. This information is not intended to replace advice given to you by your health care provider. Make sure you discuss any questions you have with your health care provider. Document Revised: 09/09/2020 Document Reviewed: 09/09/2020 Elsevier Patient Education  2023 [employer]  

## 2022-08-11 ENCOUNTER — Ambulatory Visit: Payer: Medicaid Other | Admitting: Family

## 2022-08-17 ENCOUNTER — Ambulatory Visit: Payer: BC Managed Care – PPO | Admitting: Family

## 2022-08-17 ENCOUNTER — Encounter: Payer: Self-pay | Admitting: Family

## 2022-08-17 VITALS — BP 110/76 | HR 69 | Temp 98.8°F | Ht 65.0 in | Wt 202.6 lb

## 2022-08-17 DIAGNOSIS — Z0001 Encounter for general adult medical examination with abnormal findings: Secondary | ICD-10-CM | POA: Diagnosis not present

## 2022-08-17 DIAGNOSIS — E669 Obesity, unspecified: Secondary | ICD-10-CM | POA: Diagnosis not present

## 2022-08-17 DIAGNOSIS — F411 Generalized anxiety disorder: Secondary | ICD-10-CM

## 2022-08-17 DIAGNOSIS — Z0000 Encounter for general adult medical examination without abnormal findings: Secondary | ICD-10-CM

## 2022-08-17 DIAGNOSIS — Z713 Dietary counseling and surveillance: Secondary | ICD-10-CM

## 2022-08-17 LAB — CBC WITH DIFFERENTIAL/PLATELET

## 2022-08-17 LAB — LIPID PANEL

## 2022-08-17 MED ORDER — SEMAGLUTIDE-WEIGHT MANAGEMENT 0.5 MG/0.5ML ~~LOC~~ SOAJ: 0.5000 mg | SUBCUTANEOUS | 0 refills | Status: AC

## 2022-08-17 MED ORDER — SEMAGLUTIDE-WEIGHT MANAGEMENT 1 MG/0.5ML ~~LOC~~ SOAJ: 1.0000 mg | SUBCUTANEOUS | 0 refills | Status: AC

## 2022-08-17 MED ORDER — SEMAGLUTIDE-WEIGHT MANAGEMENT 0.25 MG/0.5ML ~~LOC~~ SOAJ: 0.2500 mg | SUBCUTANEOUS | 0 refills | Status: AC

## 2022-08-17 MED ORDER — FLUOXETINE HCL 40 MG PO CAPS: 80.0000 mg | ORAL_CAPSULE | Freq: Every day | ORAL | 3 refills | Status: DC

## 2022-08-17 NOTE — Patient Instructions (Signed)
Calorie Counting for Weight Loss Calories are units of energy. Your body needs a certain number of calories from food to keep going throughout the day. When you eat or drink more calories than your body needs, your body stores the extra calories mostly as fat. When you eat or drink fewer calories than your body needs, your body burns fat to get the energy it needs. Calorie counting means keeping track of how many calories you eat and drink each day. Calorie counting can be helpful if you need to lose weight. If you eat fewer calories than your body needs, you should lose weight. Ask your health care provider what a healthy weight is for you. For calorie counting to work, you will need to eat the right number of calories each day to lose a healthy amount of weight per week. A dietitian can help you figure out how many calories you need in a day and will suggest ways to reach your calorie goal. A healthy amount of weight to lose each week is usually 1-2 lb (0.5-0.9 kg). This usually means that your daily calorie intake should be reduced by 500-750 calories. Eating 1,200-1,500 calories a day can help most women lose weight. Eating 1,500-1,800 calories a day can help most men lose weight. What do I need to know about calorie counting? Work with your health care provider or dietitian to determine how many calories you should get each day. To meet your daily calorie goal, you will need to: Find out how many calories are in each food that you would like to eat. Try to do this before you eat. Decide how much of the food you plan to eat. Keep a food log. Do this by writing down what you ate and how many calories it had. To successfully lose weight, it is important to balance calorie counting with a healthy lifestyle that includes regular activity. Where do I find calorie information?  The number of calories in a food can be found on a Nutrition Facts label. If a food does not have a Nutrition Facts label, try  to look up the calories online or ask your dietitian for help. Remember that calories are listed per serving. If you choose to have more than one serving of a food, you will have to multiply the calories per serving by the number of servings you plan to eat. For example, the label on a package of bread might say that a serving size is 1 slice and that there are 90 calories in a serving. If you eat 1 slice, you will have eaten 90 calories. If you eat 2 slices, you will have eaten 180 calories. How do I keep a food log? After each time that you eat, record the following in your food log as soon as possible: What you ate. Be sure to include toppings, sauces, and other extras on the food. How much you ate. This can be measured in cups, ounces, or number of items. How many calories were in each food and drink. The total number of calories in the food you ate. Keep your food log near you, such as in a pocket-sized notebook or on an app or website on your mobile phone. Some programs will calculate calories for you and show you how many calories you have left to meet your daily goal. What are some portion-control tips? Know how many calories are in a serving. This will help you know how many servings you can have of a certain   food. Use a measuring cup to measure serving sizes. You could also try weighing out portions on a kitchen scale. With time, you will be able to estimate serving sizes for some foods. Take time to put servings of different foods on your favorite plates or in your favorite bowls and cups so you know what a serving looks like. Try not to eat straight from a food's packaging, such as from a bag or box. Eating straight from the package makes it hard to see how much you are eating and can lead to overeating. Put the amount you would like to eat in a cup or on a plate to make sure you are eating the right portion. Use smaller plates, glasses, and bowls for smaller portions and to prevent  overeating. Try not to multitask. For example, avoid watching TV or using your computer while eating. If it is time to eat, sit down at a table and enjoy your food. This will help you recognize when you are full. It will also help you be more mindful of what and how much you are eating. What are tips for following this plan? Reading food labels Check the calorie count compared with the serving size. The serving size may be smaller than what you are used to eating. Check the source of the calories. Try to choose foods that are high in protein, fiber, and vitamins, and low in saturated fat, trans fat, and sodium. Shopping Read nutrition labels while you shop. This will help you make healthy decisions about which foods to buy. Pay attention to nutrition labels for low-fat or fat-free foods. These foods sometimes have the same number of calories or more calories than the full-fat versions. They also often have added sugar, starch, or salt to make up for flavor that was removed with the fat. Make a grocery list of lower-calorie foods and stick to it. Cooking Try to cook your favorite foods in a healthier way. For example, try baking instead of frying. Use low-fat dairy products. Meal planning Use more fruits and vegetables. One-half of your plate should be fruits and vegetables. Include lean proteins, such as chicken, turkey, and fish. Lifestyle Each week, aim to do one of the following: 150 minutes of moderate exercise, such as walking. 75 minutes of vigorous exercise, such as running. General information Know how many calories are in the foods you eat most often. This will help you calculate calorie counts faster. Find a way of tracking calories that works for you. Get creative. Try different apps or programs if writing down calories does not work for you. What foods should I eat?  Eat nutritious foods. It is better to have a nutritious, high-calorie food, such as an avocado, than a food with  few nutrients, such as a bag of potato chips. Use your calories on foods and drinks that will fill you up and will not leave you hungry soon after eating. Examples of foods that fill you up are nuts and nut butters, vegetables, lean proteins, and high-fiber foods such as whole grains. High-fiber foods are foods with more than 5 g of fiber per serving. Pay attention to calories in drinks. Low-calorie drinks include water and unsweetened drinks. The items listed above may not be a complete list of foods and beverages you can eat. Contact a dietitian for more information. What foods should I limit? Limit foods or drinks that are not good sources of vitamins, minerals, or protein or that are high in unhealthy fats. These   include: Candy. Other sweets. Sodas, specialty coffee drinks, alcohol, and juice. The items listed above may not be a complete list of foods and beverages you should avoid. Contact a dietitian for more information. How do I count calories when eating out? Pay attention to portions. Often, portions are much larger when eating out. Try these tips to keep portions smaller: Consider sharing a meal instead of getting your own. If you get your own meal, eat only half of it. Before you start eating, ask for a container and put half of your meal into it. When available, consider ordering smaller portions from the menu instead of full portions. Pay attention to your food and drink choices. Knowing the way food is cooked and what is included with the meal can help you eat fewer calories. If calories are listed on the menu, choose the lower-calorie options. Choose dishes that include vegetables, fruits, whole grains, low-fat dairy products, and lean proteins. Choose items that are boiled, broiled, grilled, or steamed. Avoid items that are buttered, battered, fried, or served with cream sauce. Items labeled as crispy are usually fried, unless stated otherwise. Choose water, low-fat milk,  unsweetened iced tea, or other drinks without added sugar. If you want an alcoholic beverage, choose a lower-calorie option, such as a glass of wine or light beer. Ask for dressings, sauces, and syrups on the side. These are usually high in calories, so you should limit the amount you eat. If you want a salad, choose a garden salad and ask for grilled meats. Avoid extra toppings such as bacon, cheese, or fried items. Ask for the dressing on the side, or ask for olive oil and vinegar or lemon to use as dressing. Estimate how many servings of a food you are given. Knowing serving sizes will help you be aware of how much food you are eating at restaurants. Where to find more information Centers for Disease Control and Prevention: www.cdc.gov [place] Department of Agriculture: myplate.gov Summary Calorie counting means keeping track of how many calories you eat and drink each day. If you eat fewer calories than your body needs, you should lose weight. A healthy amount of weight to lose per week is usually 1-2 lb (0.5-0.9 kg). This usually means reducing your daily calorie intake by 500-750 calories. The number of calories in a food can be found on a Nutrition Facts label. If a food does not have a Nutrition Facts label, try to look up the calories online or ask your dietitian for help. Use smaller plates, glasses, and bowls for smaller portions and to prevent overeating. Use your calories on foods and drinks that will fill you up and not leave you hungry shortly after a meal. This information is not intended to replace advice given to you by your health care provider. Make sure you discuss any questions you have with your health care provider. Document Revised: 08/07/2019 Document Reviewed: 08/07/2019 Elsevier Patient Education  2023 Elsevier Inc.  

## 2022-08-17 NOTE — Progress Notes (Signed)
Subjective:    Patient ID: Andrea Huynh, female    DOB: [birth year], 32 y.o.   MRN: [id number]  Chief Complaint  Patient presents with   Medical Management of Chronic Issues    Wants wt loss shot    Pt presents to the office today for CPE. She also requesting medication to help lose weight. Patient's BMI is >30 mg/m2.  Patient's current BMI is Body mass index is 33.71 kg/m.Marland Kitchen  Patient is currently enrolled in a healthy eating plan along with encouraged exercise.  She goes to [employer] 4 times a week for one hour. She has decreased soda and increase water.   Patient has contraindications to phentermine, Contrave & Qsymia (contains phentermine) because of palpitations/chest pain.  Patient does not have a personal or family history of medullary thyroid carcinoma (MTC) or Multiple Endocrine Neoplasia syndrome type 2 (MEN 2).  Anxiety Presents for follow-up visit. Symptoms include excessive worry, nervous/anxious behavior and restlessness. Patient reports no irritability or nausea. Symptoms occur occasionally. The severity of symptoms is mild.    Depression        This is a chronic problem.  The current episode started more than 1 year ago.   Associated symptoms include restlessness.  Associated symptoms include no helplessness, no hopelessness and not sad.  Past treatments include SSRIs - Selective serotonin reuptake inhibitors.  Past medical history includes anxiety.       Review of Systems  Constitutional:  Negative for irritability.  Gastrointestinal:  Negative for nausea.  Psychiatric/Behavioral:  Positive for depression. The patient is nervous/anxious.   All other systems reviewed and are negative.  Family History  Problem Relation Age of Onset   Constipation Other    Hypertension Mother    Colon cancer Neg Hx    Colon polyps Neg Hx    Stomach cancer Neg Hx    Social History   Socioeconomic History   Marital status: Legally Separated    Spouse name: Not on file    Number of children: Not on file   Years of education: Not on file   Highest education level: Not on file  Occupational History   Not on file  Tobacco Use   Smoking status: Never   Smokeless tobacco: Never  Substance and Sexual Activity   Alcohol use: No   Drug use: No   Sexual activity: Yes    Birth control/protection: None, I.U.D.  Other Topics Concern   Not on file  Social History Narrative   STILL WORKING WITH [employer]. HAS A BOYFRIEND.   Social Determinants of Health   Financial Resource Strain: Not on file  Food Insecurity: Not on file  Transportation Needs: Not on file  Physical Activity: Not on file  Stress: Not on file  Social Connections: Not on file       Objective:   Physical Exam Vitals reviewed.  Constitutional:      General: She is not in acute distress.    Appearance: She is well-developed. She is obese.  HENT:     Head: Normocephalic and atraumatic.     Right Ear: Tympanic membrane normal.     Left Ear: Tympanic membrane normal.  Eyes:     Pupils: Pupils are equal, round, and reactive to light.  Neck:     Thyroid: No thyromegaly.  Cardiovascular:     Rate and Rhythm: Normal rate and regular rhythm.     Heart sounds: Normal heart sounds. No murmur heard. Pulmonary:  Effort: Pulmonary effort is normal. No respiratory distress.     Breath sounds: Normal breath sounds. No wheezing.  Abdominal:     General: Bowel sounds are normal. There is no distension.     Palpations: Abdomen is soft.     Tenderness: There is no abdominal tenderness.  Musculoskeletal:        General: No tenderness. Normal range of motion.     Cervical back: Normal range of motion and neck supple.  Skin:    General: Skin is warm and dry.  Neurological:     Mental Status: She is alert and oriented to person, place, and time.     Cranial Nerves: No cranial nerve deficit.     Deep Tendon Reflexes: Reflexes are normal and symmetric.  Psychiatric:        Behavior: Behavior normal.         Thought Content: Thought content normal.        Judgment: Judgment normal.        BP 110/76   Pulse 69   Temp 98.8 F (37.1 C) (Temporal)   Ht 5\' 5"  (1.651 m)   Wt 202 lb 9.6 oz (91.9 kg)   SpO2 96%   BMI 33.71 kg/m   Assessment & Plan:  Andrea Huynh comes in today with chief complaint of Medical Management of Chronic Issues (Wants wt loss shot )   Diagnosis and orders addressed:  1. Annual physical exam - CMP14+EGFR - CBC with Differential/Platelet - Lipid panel - TSH  2. GAD (generalized anxiety disorder) - FLUoxetine (PROZAC) 40 MG capsule; Take 2 capsules (80 mg total) by mouth daily. 30 day supply given. Patient must be seen for any further refills.  Dispense: 180 capsule; Refill: 3 - CMP14+EGFR - CBC with Differential/Platelet  3. Obesity (BMI 30-39.9) - Semaglutide-Weight Management 0.25 MG/0.5ML SOAJ; Inject 0.25 mg into the skin once a week for 28 days.  Dispense: 2 mL; Refill: 0 - Semaglutide-Weight Management 0.5 MG/0.5ML SOAJ; Inject 0.5 mg into the skin once a week for 28 days.  Dispense: 2 mL; Refill: 0 - Semaglutide-Weight Management 1 MG/0.5ML SOAJ; Inject 1 mg into the skin once a week for 28 days.  Dispense: 2 mL; Refill: 0 - CMP14+EGFR - CBC with Differential/Platelet  4. Weight loss counseling, encounter for [employer]  Encourage healthy diet and exercise  - Semaglutide-Weight Management 0.25 MG/0.5ML SOAJ; Inject 0.25 mg into the skin once a week for 28 days.  Dispense: 2 mL; Refill: 0 - Semaglutide-Weight Management 0.5 MG/0.5ML SOAJ; Inject 0.5 mg into the skin once a week for 28 days.  Dispense: 2 mL; Refill: 0 - Semaglutide-Weight Management 1 MG/0.5ML SOAJ; Inject 1 mg into the skin once a week for 28 days.  Dispense: 2 mL; Refill: 0 - CMP14+EGFR - CBC with Differential/Platelet   Labs pending Health Maintenance reviewed Diet and exercise encouraged  Follow up plan: 3 months    Evelina Dun, FNP

## 2022-08-18 ENCOUNTER — Other Ambulatory Visit: Payer: Self-pay | Admitting: Family

## 2022-08-18 DIAGNOSIS — E785 Hyperlipidemia, unspecified: Secondary | ICD-10-CM | POA: Insufficient documentation

## 2022-08-18 DIAGNOSIS — E782 Mixed hyperlipidemia: Secondary | ICD-10-CM

## 2022-08-18 LAB — LIPID PANEL
Chol/HDL Ratio: 5.1 ratio — ABNORMAL HIGH (ref 0.0–4.4)
Cholesterol, Total: 219 mg/dL — ABNORMAL HIGH (ref 100–199)
HDL: 43 mg/dL (ref >39)
LDL Chol Calc (NIH): 134 mg/dL — ABNORMAL HIGH (ref 0–99)
Triglycerides: 232 mg/dL — ABNORMAL HIGH (ref 0–149)
VLDL Cholesterol Cal: 42 mg/dL — ABNORMAL HIGH (ref 5–40)

## 2022-08-18 LAB — CBC WITH DIFFERENTIAL/PLATELET
Basophils Absolute: 0.1 10*3/uL (ref 0.0–0.2)
Basos: 1 %
EOS (ABSOLUTE): 0.2 10*3/uL (ref 0.0–0.4)
Eos: 3 %
Hematocrit: 35 % (ref 34.0–46.6)
Hemoglobin: 12 g/dL (ref 11.1–15.9)
Immature Grans (Abs): 0 10*3/uL (ref 0.0–0.1)
Immature Granulocytes: 0 %
Lymphocytes Absolute: 1.8 10*3/uL (ref 0.7–3.1)
Lymphs: 23 %
MCH: 30.5 pg (ref 26.6–33.0)
MCHC: 34.3 g/dL (ref 31.5–35.7)
MCV: 89 fL (ref 79–97)
Monocytes Absolute: 0.4 10*3/uL (ref 0.1–0.9)
Monocytes: 6 %
Neutrophils Absolute: 5.2 10*3/uL (ref 1.4–7.0)
Neutrophils: 67 %
Platelets: 332 10*3/uL (ref 150–450)
RBC: 3.94 x10E6/uL (ref 3.77–5.28)
RDW: 12.7 % (ref 11.7–15.4)
WBC: 7.7 10*3/uL (ref 3.4–10.8)

## 2022-08-18 LAB — CMP14+EGFR
ALT: 15 IU/L (ref 0–32)
AST: 18 IU/L (ref 0–40)
Albumin/Globulin Ratio: 1.6 (ref 1.2–2.2)
Albumin: 4.4 g/dL (ref 3.9–4.9)
Alkaline Phosphatase: 73 IU/L (ref 44–121)
BUN/Creatinine Ratio: 26 — ABNORMAL HIGH (ref 9–23)
BUN: 14 mg/dL (ref 6–20)
Bilirubin Total: 0.2 mg/dL (ref 0.0–1.2)
CO2: 22 mmol/L (ref 20–29)
Calcium: 9.5 mg/dL (ref 8.7–10.2)
Chloride: 102 mmol/L (ref 96–106)
Creatinine, Ser: 0.54 mg/dL — ABNORMAL LOW (ref 0.57–1.00)
Globulin, Total: 2.7 g/dL (ref 1.5–4.5)
Glucose: 84 mg/dL (ref 70–99)
Potassium: 4.9 mmol/L (ref 3.5–5.2)
Sodium: 137 mmol/L (ref 134–144)
Total Protein: 7.1 g/dL (ref 6.0–8.5)
eGFR: 125 mL/min/{1.73_m2} (ref >59)

## 2022-08-18 LAB — TSH: TSH: 1.3 u[IU]/mL (ref 0.450–4.500)

## 2022-11-21 ENCOUNTER — Encounter: Payer: Self-pay | Admitting: Family Medicine

## 2022-11-21 ENCOUNTER — Telehealth: Payer: Self-pay | Admitting: *Deleted

## 2022-11-21 ENCOUNTER — Ambulatory Visit (INDEPENDENT_AMBULATORY_CARE_PROVIDER_SITE_OTHER): Payer: BC Managed Care – PPO | Admitting: Family Medicine

## 2022-11-21 VITALS — BP 125/86 | HR 99 | Temp 98.1°F | Ht 65.0 in | Wt 210.2 lb

## 2022-11-21 DIAGNOSIS — R079 Chest pain, unspecified: Secondary | ICD-10-CM

## 2022-11-21 NOTE — Telephone Encounter (Signed)
School nurse called to report that patient was not feeling well and went to school nurse. Blood pressure reading was 148/106.  We did not have openings. I advised urgent care.

## 2022-11-21 NOTE — Progress Notes (Signed)
Subjective:  Patient ID: Andrea Huynh, female    DOB: January 23, 1991  Age: 32 y.o. MRN: [id number]  CC: Hypertension   HPI [name] presents for elevated BP & chest pain. Onset earlier today with feeling of dizzines described as being off balance. Then developed substernal chest pain described as pressure. This lasted about 1/2 hour. Blood pressure went to 142/108. Checked twice by school nurse. Symptoms have since resolved. She relates a similar incident that occurred in January of this year. For that one she went to the [facility]. Report reviewed states pain then lasted 12 hours. Troponins & CTPA were negative.  Today she had tingling in her feet with the chest pain. She does not use hormonal contraception.      11/21/2022    2:36 PM 11/21/2022    2:31 PM 08/17/2022   10:50 AM  Depression screen PHQ 2/9  Decreased Interest 0 0 0  Down, Depressed, Hopeless 0 0 0  PHQ - 2 Score 0 0 0  Altered sleeping 0  2  Tired, decreased energy 0  1  Change in appetite 0  0  Feeling bad or failure about yourself  0  0  Trouble concentrating 0  0  Moving slowly or fidgety/restless 0  0  Suicidal thoughts 0  0  PHQ-9 Score 0  3  Difficult doing work/chores Not difficult at all  Not difficult at all    History Andrea Huynh has a past medical history of Abdominal pain, RLQ (2010), Anxiety, Gastroparesis (JUN 2015 SMART PILL), and Solitary rectal ulcer SYNDROME (JAN 2013/JUN 2015).   She has a past surgical history that includes Colonoscopy (08/04/2011); Flexible sigmoidoscopy (N/A, 12/29/2013); and Extracorporeal shock wave lithotripsy (Right, 12/27/2020).   Her family history includes Constipation in an other family member; Hypertension in her mother.She reports that she has never smoked. She has never used smokeless tobacco. She reports that she does not drink alcohol and does not use drugs.    ROS Review of Systems  Constitutional: Negative.  Negative for activity change.  HENT:  Negative for  congestion.   Respiratory:  Negative for shortness of breath.   Cardiovascular:  Positive for chest pain. Negative for palpitations and leg swelling.  Gastrointestinal:  Negative for abdominal pain, constipation, diarrhea, nausea and vomiting.  Genitourinary: Negative.   Neurological:  Positive for dizziness. Negative for headaches.  Psychiatric/Behavioral:  Negative for sleep disturbance.     Objective:  BP 125/86   Pulse 99   Temp 98.1 F (36.7 C)   Ht 5\' 5"  (1.651 m)   Wt 210 lb 3.2 oz (95.3 kg)   SpO2 98%   BMI 34.98 kg/m   BP Readings from Last 3 Encounters:  11/21/22 125/86  08/17/22 110/76  07/25/22 114/82    Wt Readings from Last 3 Encounters:  11/21/22 210 lb 3.2 oz (95.3 kg)  08/17/22 202 lb 9.6 oz (91.9 kg)  07/25/22 200 lb 9.6 oz (91 kg)     Physical Exam Constitutional:      General: She is not in acute distress.    Appearance: She is well-developed.  HENT:     Head: Normocephalic and atraumatic.  Eyes:     Conjunctiva/sclera: Conjunctivae normal.     Pupils: Pupils are equal, round, and reactive to light.  Neck:     Thyroid: No thyromegaly.  Cardiovascular:     Rate and Rhythm: Normal rate and regular rhythm.     Heart sounds: Normal heart sounds. No murmur  heard. Pulmonary:     Effort: Pulmonary effort is normal. No respiratory distress.     Breath sounds: Normal breath sounds. No wheezing or rales.  Abdominal:     General: Bowel sounds are normal. There is no distension.     Palpations: Abdomen is soft.     Tenderness: There is no abdominal tenderness.  Musculoskeletal:        General: Normal range of motion.     Cervical back: Normal range of motion and neck supple.  Lymphadenopathy:     Cervical: No cervical adenopathy.  Skin:    General: Skin is warm and dry.  Neurological:     Mental Status: She is alert and oriented to person, place, and time.  Psychiatric:        Behavior: Behavior normal.        Thought Content: Thought content  normal.        Judgment: Judgment normal.       Assessment & Plan:   Emilyah was seen today for hypertension.  Diagnoses and all orders for this visit:  Chest pain, unspecified type -     EKG 12-Lead       I am having Andrea Huynh maintain her FLUoxetine.  Allergies as of 11/21/2022   No Known Allergies      Medication List        Accurate as of Nov 21, 2022  3:17 PM. If you have any questions, ask your nurse or doctor.          FLUoxetine 40 MG capsule Commonly known as: PROZAC Take 2 capsules (80 mg total) by mouth daily. 30 day supply given. Patient must be seen for any further refills.         Follow-up: No follow-ups on file.  Mechele Claude, M.D.

## 2022-12-18 ENCOUNTER — Ambulatory Visit: Payer: BC Managed Care – PPO | Admitting: Family

## 2022-12-19 ENCOUNTER — Encounter: Payer: Self-pay | Admitting: Family

## 2022-12-19 ENCOUNTER — Ambulatory Visit (INDEPENDENT_AMBULATORY_CARE_PROVIDER_SITE_OTHER): Payer: BC Managed Care – PPO | Admitting: Family

## 2022-12-19 VITALS — BP 122/78 | HR 90 | Temp 96.8°F | Ht 65.0 in | Wt 208.8 lb

## 2022-12-19 DIAGNOSIS — R002 Palpitations: Secondary | ICD-10-CM | POA: Diagnosis not present

## 2022-12-19 DIAGNOSIS — F411 Generalized anxiety disorder: Secondary | ICD-10-CM

## 2022-12-19 DIAGNOSIS — R030 Elevated blood-pressure reading, without diagnosis of hypertension: Secondary | ICD-10-CM | POA: Diagnosis not present

## 2022-12-19 MED ORDER — DESVENLAFAXINE SUCCINATE ER 100 MG PO TB24: 100.0000 mg | ORAL_TABLET | Freq: Every day | ORAL | 1 refills | Status: DC

## 2022-12-19 MED ORDER — BUSPIRONE HCL 5 MG PO TABS: 5.0000 mg | ORAL_TABLET | Freq: Three times a day (TID) | ORAL | 1 refills | Status: AC | PRN

## 2022-12-19 NOTE — Progress Notes (Signed)
Subjective:    Patient ID: Andrea Huynh, female    DOB: [birth year], 32 y.o.   MRN: [id number]  Chief Complaint  Patient presents with   Hypertension   PT presents to the office today elevated BP and chest pain. Reports this has happened twice. She went to the ED on 07/23/22 and our office on 11/21/22.   While in the ED she had a negative CT chest for PE.  Palpitations  This is a new problem. The current episode started more than 1 month ago. The problem has been waxing and waning. On average, each episode lasts 35 minutes. Associated symptoms include anxiety, chest fullness, chest pain, an irregular heartbeat and shortness of breath. Pertinent negatives include no nausea or vomiting. She has tried nothing for the symptoms. The treatment provided no relief.  Anxiety Presents for follow-up visit. Symptoms include chest pain, excessive worry, irritability, nervous/anxious behavior, palpitations and shortness of breath. Patient reports no nausea. Symptoms occur most days. The severity of symptoms is moderate.        Review of Systems  Constitutional:  Positive for irritability.  Respiratory:  Positive for shortness of breath.   Cardiovascular:  Positive for chest pain and palpitations.  Gastrointestinal:  Negative for nausea and vomiting.  Psychiatric/Behavioral:  The patient is nervous/anxious.   All other systems reviewed and are negative.      Objective:   Physical Exam Vitals reviewed.  Constitutional:      General: She is not in acute distress.    Appearance: She is well-developed.  HENT:     Head: Normocephalic and atraumatic.     Right Ear: Tympanic membrane normal.     Left Ear: Tympanic membrane normal.  Eyes:     Pupils: Pupils are equal, round, and reactive to light.  Neck:     Thyroid: No thyromegaly.  Cardiovascular:     Rate and Rhythm: Normal rate and regular rhythm.     Heart sounds: Normal heart sounds. No murmur heard. Pulmonary:     Effort:  Pulmonary effort is normal. No respiratory distress.     Breath sounds: Normal breath sounds. No wheezing.  Abdominal:     General: Bowel sounds are normal. There is no distension.     Palpations: Abdomen is soft.     Tenderness: There is no abdominal tenderness.  Musculoskeletal:        General: No tenderness. Normal range of motion.     Cervical back: Normal range of motion and neck supple.  Skin:    General: Skin is warm and dry.  Neurological:     Mental Status: She is alert and oriented to person, place, and time.     Cranial Nerves: No cranial nerve deficit.     Deep Tendon Reflexes: Reflexes are normal and symmetric.  Psychiatric:        Behavior: Behavior normal.        Thought Content: Thought content normal.        Judgment: Judgment normal.       BP (!) 132/90   Pulse 90   Temp (!) 96.8 F (36 C) (Temporal)   Ht 5\' 5"  (1.651 m)   Wt 208 lb 12.8 oz (94.7 kg)   SpO2 98%   BMI 34.75 kg/m      Assessment & Plan:  Andrea Huynh comes in today with chief complaint of Hypertension   Diagnosis and orders addressed:  1. Palpitations - EKG 12-Lead - Anemia Profile B -  TSH  2. Elevated blood pressure reading - EKG 12-Lead  3. GAD (generalized anxiety disorder) - desvenlafaxine (PRISTIQ) 100 MG 24 hr tablet; Take 1 tablet (100 mg total) by mouth daily.  Dispense: 90 tablet; Refill: 1 - busPIRone (BUSPAR) 5 MG tablet; Take 1 tablet (5 mg total) by mouth 3 (three) times daily as needed.  Dispense: 90 tablet; Refill: 1  Will stop Prozac and start Pristiq 100 mg  Stress management  If palpitations continue will need to wear Zio and cardiologists referral. She refuses Zio today Labs pending  Follow up in 1 month   Andrea Rodney, FNP

## 2022-12-19 NOTE — Patient Instructions (Signed)

## 2022-12-20 ENCOUNTER — Encounter: Payer: Self-pay | Admitting: Family

## 2022-12-20 LAB — ANEMIA PROFILE B
Basophils Absolute: 0.1 10*3/uL (ref 0.0–0.2)
Basos: 1 %
EOS (ABSOLUTE): 0.2 10*3/uL (ref 0.0–0.4)
Eos: 2 %
Ferritin: 126 ng/mL (ref 15–150)
Folate: 16.7 ng/mL (ref >3.0)
Hematocrit: 33.3 % — ABNORMAL LOW (ref 34.0–46.6)
Hemoglobin: 11 g/dL — ABNORMAL LOW (ref 11.1–15.9)
Immature Grans (Abs): 0 10*3/uL (ref 0.0–0.1)
Immature Granulocytes: 0 %
Iron Saturation: 20 % (ref 15–55)
Iron: 76 ug/dL (ref 27–159)
Lymphocytes Absolute: 1.3 10*3/uL (ref 0.7–3.1)
Lymphs: 18 %
MCH: 30.3 pg (ref 26.6–33.0)
MCHC: 33 g/dL (ref 31.5–35.7)
MCV: 92 fL (ref 79–97)
Monocytes Absolute: 0.4 10*3/uL (ref 0.1–0.9)
Monocytes: 5 %
Neutrophils Absolute: 5.7 10*3/uL (ref 1.4–7.0)
Neutrophils: 74 %
Platelets: 332 10*3/uL (ref 150–450)
RBC: 3.63 x10E6/uL — ABNORMAL LOW (ref 3.77–5.28)
RDW: 12.8 % (ref 11.7–15.4)
Retic Ct Pct: 2 % (ref 0.6–2.6)
Total Iron Binding Capacity: 381 ug/dL (ref 250–450)
UIBC: 305 ug/dL (ref 131–425)
Vitamin B-12: 374 pg/mL (ref 232–1245)
WBC: 7.6 10*3/uL (ref 3.4–10.8)

## 2022-12-20 LAB — TSH: TSH: 1.02 u[IU]/mL (ref 0.450–4.500)

## 2022-12-26 IMAGING — CT CT RENAL STONE PROTOCOL
2 of 4 series · 16 of 46 positions shown, 18 images · non-contrast
Comparison: Multiple priors including most recent CT December 15, 2020

CLINICAL DATA: Right flank pain since last night denies hematuria.
History of renal stones.



[Series 2: axial st · axial · 0.89mm/px · z∈[+856,+1311]mm · 13 of 103 slices shown, 15 images]
[im 6/103  soft-tissue]
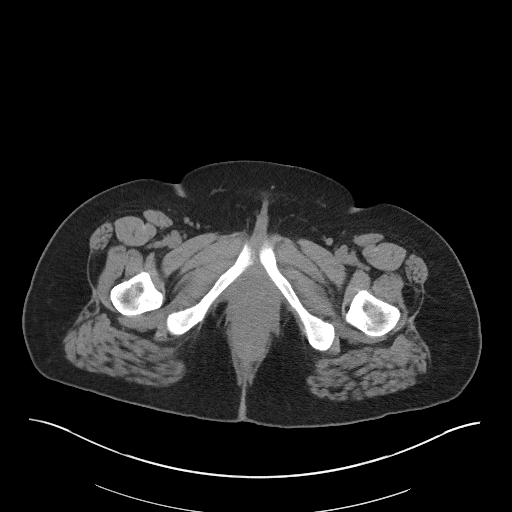
[im 6/103  bone]
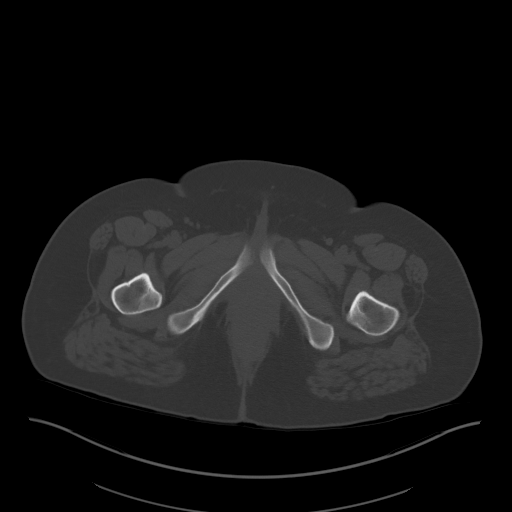
[im 12/103  soft-tissue]
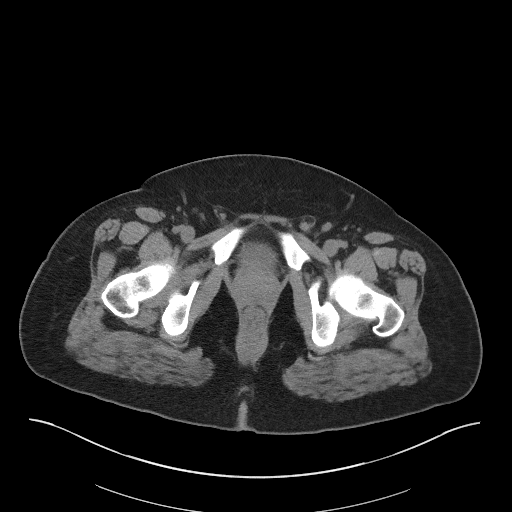
[im 23/103  soft-tissue]
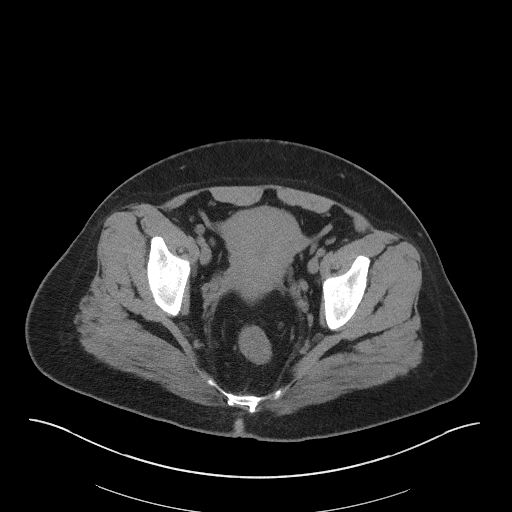
[im 29/103  soft-tissue]
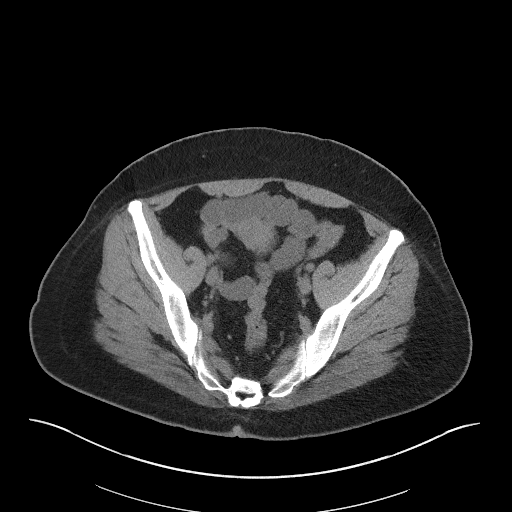
[im 35/103  soft-tissue]
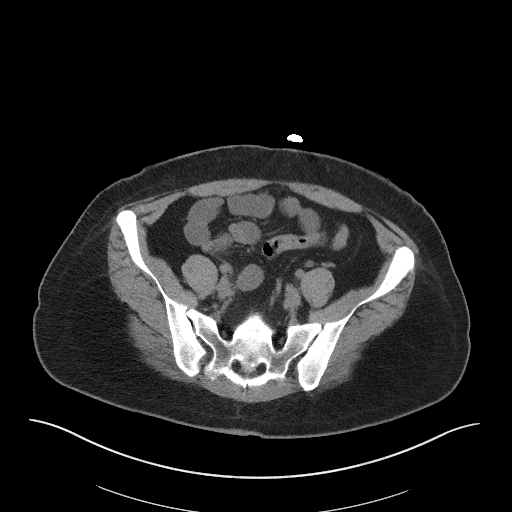
[im 46/103  soft-tissue]
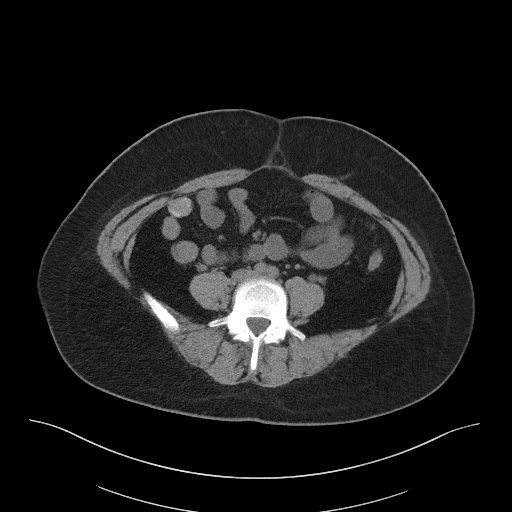
[im 52/103  soft-tissue]
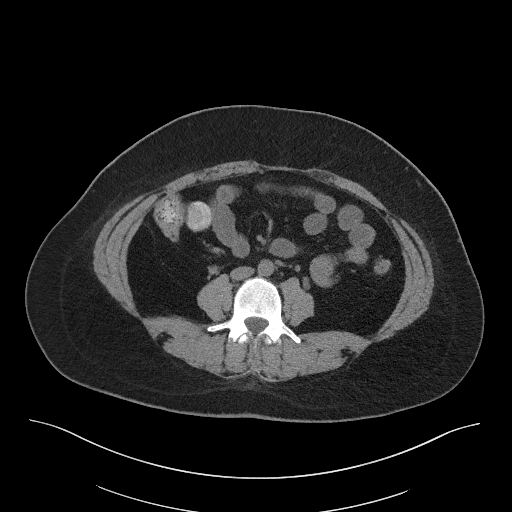
[im 57/103  soft-tissue]
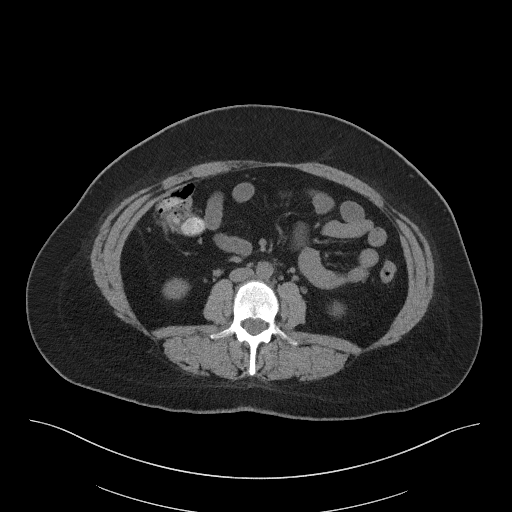
[im 69/103  soft-tissue]
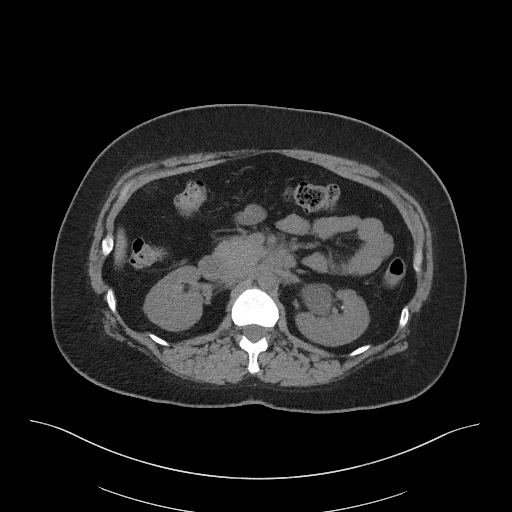
[im 69/103  bone]
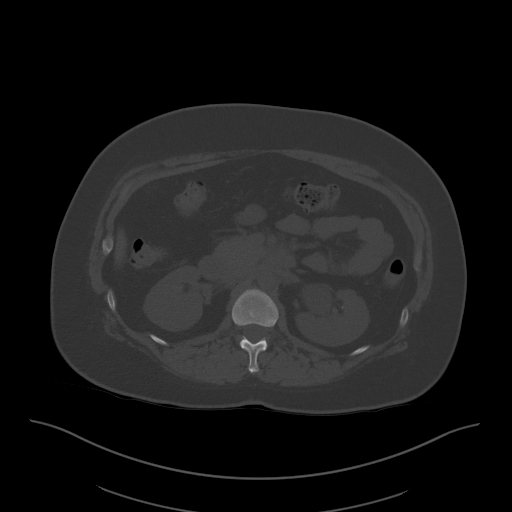
[im 74/103  soft-tissue]
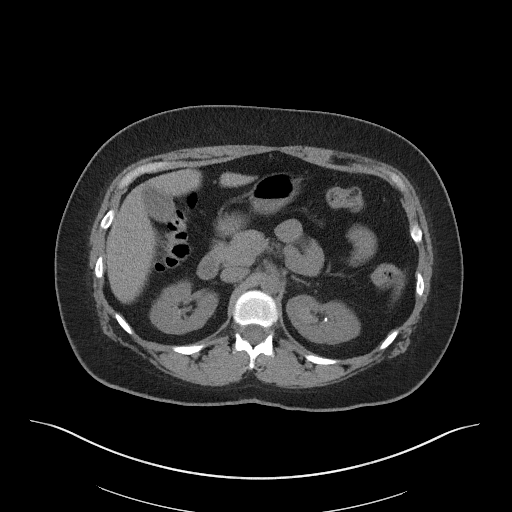
[im 80/103  soft-tissue]
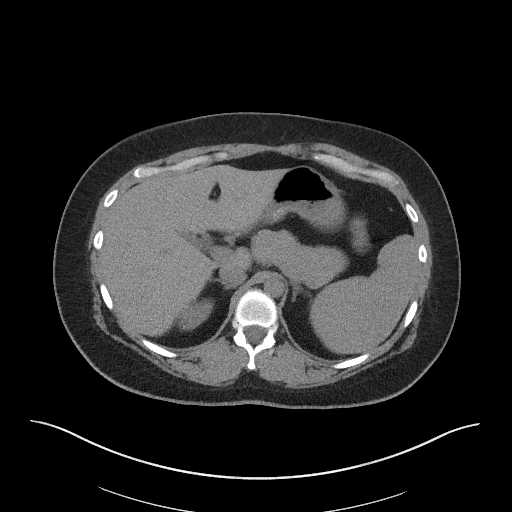
[im 91/103  soft-tissue]
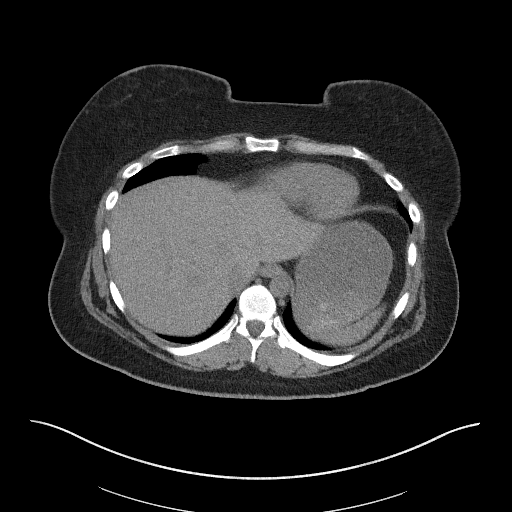
[im 97/103  soft-tissue]
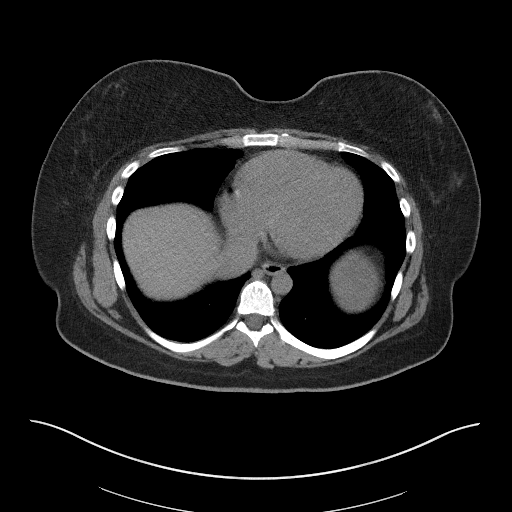

[Series 5: coronal st · coronal · 0.86mm/px · 3 of 101 slices shown]
[im 34/101  soft-tissue]
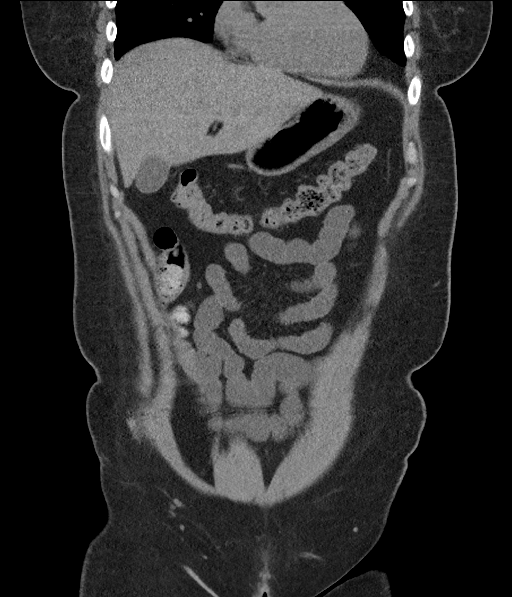
[im 45/101  soft-tissue]
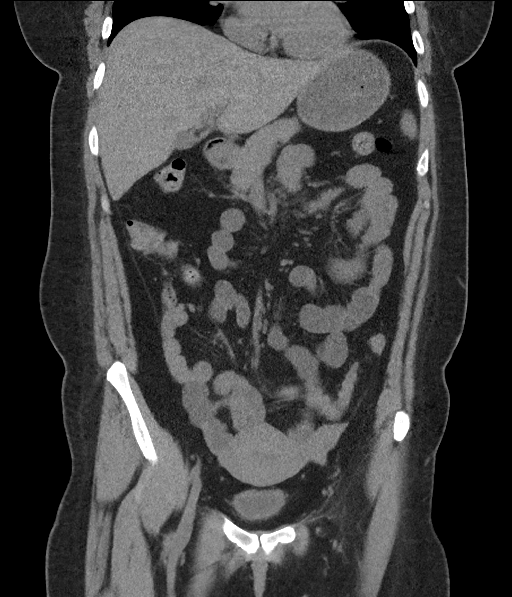
[im 56/101  soft-tissue]
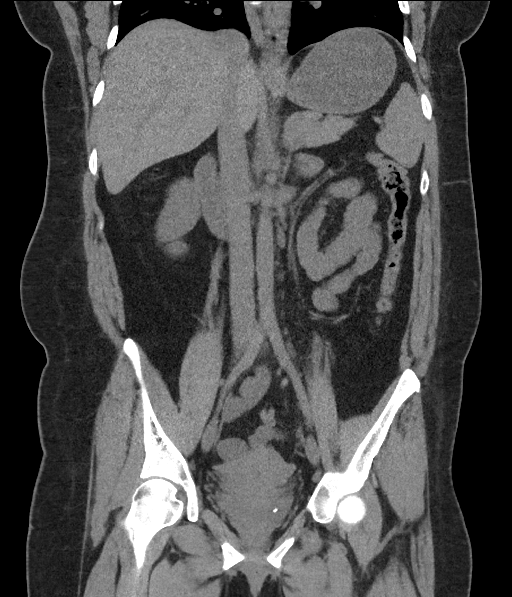

[16 of 46 positions shown; findings below may reference images not displayed]

FINDINGS: Lower chest: No acute abnormality.

Hepatobiliary: Unremarkable noncontrast appearance of the hepatic
parenchyma. Gallbladder is unremarkable. No biliary ductal dilation.

Pancreas: No pancreatic ductal dilation or evidence of acute
inflammation.

Spleen: Normal in size without focal abnormality.

Adrenals/Urinary Tract: Bilateral adrenal glands appear normal.

Mild left hydroureteronephrosis to the level of a 2 mm stone either
in the ureterovesicular junction or laying dependently in the
urinary bladder.

Additional bilateral punctate nonobstructive renal stones measuring
up to 2 mm. No right-sided hydronephrosis. Urinary bladder is
predominantly decompressed limiting evaluation.

Stomach/Bowel: No enteric contrast was administered. Stomach is
distended with ingested material without wall thickening. No
pathologic dilation of large or small bowel. The appendix and
terminal ileum appear normal. No evidence of acute bowel
inflammation.

Vascular/Lymphatic: No significant vascular findings are present. No
enlarged abdominal or pelvic lymph nodes.

Reproductive: Uterus and bilateral adnexa are unremarkable.

Other: No abdominal wall hernia or abnormality. No abdominopelvic
ascites.

Musculoskeletal: No acute or significant osseous findings.
IMPRESSION: 1. Left hydroureteronephrosis to the level of a 2 mm stone either in
the ureterovesicular junction or laying dependently in the urinary
bladder.
2. Additional bilateral punctate nonobstructive renal stones
measuring up to 2 mm.

## 2023-01-26 ENCOUNTER — Encounter: Payer: Self-pay | Admitting: Family

## 2023-01-26 ENCOUNTER — Ambulatory Visit: Payer: BC Managed Care – PPO | Admitting: Family

## 2023-01-26 VITALS — BP 118/85 | HR 85 | Temp 97.5°F | Ht 65.0 in | Wt 193.0 lb

## 2023-01-26 DIAGNOSIS — F411 Generalized anxiety disorder: Secondary | ICD-10-CM | POA: Diagnosis not present

## 2023-01-26 NOTE — Progress Notes (Signed)
   Subjective:    Patient ID: Andrea Huynh, female    DOB: Aug 08, 1990, 32 y.o.   MRN: [id number]  Chief Complaint  Patient presents with   Follow-up    PATIENT STATES SHE REALLY DOES NOT SEE A DIFFERENCE ON THE MEDICATION.   Pt presents to the office today to follow up on on GAD. She was seen on 12/19/22 and we switched her Prozac 80 mg to Pristiq 100 mg. Reports she does feel more at ease. Denies any chest pain or palpitations.   She was given Buspar 5 mg TID prn ans has not had to take in several weeks.  Anxiety Presents for follow-up visit. Symptoms include excessive worry, irritability, nervous/anxious behavior and restlessness. Patient reports no decreased concentration or depressed mood. Symptoms occur occasionally. The severity of symptoms is mild.        Review of Systems  Constitutional:  Positive for irritability.  Psychiatric/Behavioral:  Negative for decreased concentration. The patient is nervous/anxious.   All other systems reviewed and are negative.      Objective:   Physical Exam Vitals reviewed.  Constitutional:      General: She is not in acute distress.    Appearance: She is well-developed.  HENT:     Head: Normocephalic and atraumatic.  Eyes:     Pupils: Pupils are equal, round, and reactive to light.  Neck:     Thyroid: No thyromegaly.  Cardiovascular:     Rate and Rhythm: Normal rate and regular rhythm.     Heart sounds: Normal heart sounds. No murmur heard. Pulmonary:     Effort: Pulmonary effort is normal. No respiratory distress.     Breath sounds: Normal breath sounds. No wheezing.  Abdominal:     General: Bowel sounds are normal. There is no distension.     Palpations: Abdomen is soft.     Tenderness: There is no abdominal tenderness.  Musculoskeletal:        General: No tenderness. Normal range of motion.     Cervical back: Normal range of motion and neck supple.  Skin:    General: Skin is warm and dry.  Neurological:     Mental  Status: She is alert and oriented to person, place, and time.     Cranial Nerves: No cranial nerve deficit.     Deep Tendon Reflexes: Reflexes are normal and symmetric.  Psychiatric:        Behavior: Behavior normal.        Thought Content: Thought content normal.        Judgment: Judgment normal.      BP 118/85   Pulse 85   Temp (!) 97.5 F (36.4 C) (Temporal)   Ht 5\' 5"  (1.651 m)   Wt 193 lb (87.5 kg)   SpO2 98%   BMI 32.12 kg/m       Assessment & Plan:  [name] comes in today with chief complaint of Follow-up (PATIENT STATES SHE REALLY DOES NOT SEE A DIFFERENCE ON THE MEDICATION.)   Diagnosis and orders addressed:  1. GAD (generalized anxiety disorder) Continue Pristiq 100 mg  Buspar 5 mg TID Prn  Stress management  Follow up in 1 year   Jannifer Rodney, FNP

## 2023-01-26 NOTE — Patient Instructions (Signed)

## 2023-08-10 ENCOUNTER — Other Ambulatory Visit: Payer: Self-pay | Admitting: Family

## 2023-08-10 DIAGNOSIS — F411 Generalized anxiety disorder: Secondary | ICD-10-CM

## 2023-09-21 ENCOUNTER — Other Ambulatory Visit: Payer: Self-pay | Admitting: Family

## 2023-09-21 DIAGNOSIS — F411 Generalized anxiety disorder: Secondary | ICD-10-CM

## 2023-09-21 NOTE — Telephone Encounter (Signed)
 Pt last seen July 2024 and was told to follow up in 1 year but only enough refills sent in for 6 months. Ok to refill to get her through till due for 1 year or does ntbs?

## 2023-10-18 ENCOUNTER — Inpatient Hospital Stay: Admitting: Family Medicine

## 2024-02-20 ENCOUNTER — Other Ambulatory Visit: Payer: Self-pay | Admitting: Family

## 2024-02-20 DIAGNOSIS — F411 Generalized anxiety disorder: Secondary | ICD-10-CM

## 2024-02-21 NOTE — Telephone Encounter (Signed)
 Christy NTBS last OV 01/26/23 NO RF sent to pharmacy last OV greater than a year

## 2024-02-21 NOTE — Telephone Encounter (Signed)
 Spoke with pt, said that this should have been sent to [facility] and advised her to call her pharmacy to have them send it to [facility]

## 2024-03-21 ENCOUNTER — Other Ambulatory Visit: Payer: Self-pay | Admitting: Family

## 2024-03-21 DIAGNOSIS — F411 Generalized anxiety disorder: Secondary | ICD-10-CM
